# Patient Record
Sex: Female | Born: 1975
Health system: Southern US, Community
[De-identification: ages and names within clinical notes are randomized; demographics above are authoritative.]

## PROBLEM LIST (undated history)

## (undated) DIAGNOSIS — F419 Anxiety disorder, unspecified: Secondary | ICD-10-CM

## (undated) DIAGNOSIS — D649 Anemia, unspecified: Secondary | ICD-10-CM

## (undated) DIAGNOSIS — J45909 Unspecified asthma, uncomplicated: Secondary | ICD-10-CM

## (undated) DIAGNOSIS — N939 Abnormal uterine and vaginal bleeding, unspecified: Secondary | ICD-10-CM

## (undated) DIAGNOSIS — N920 Excessive and frequent menstruation with regular cycle: Secondary | ICD-10-CM

## (undated) DIAGNOSIS — N189 Chronic kidney disease, unspecified: Secondary | ICD-10-CM

## (undated) DIAGNOSIS — I10 Essential (primary) hypertension: Secondary | ICD-10-CM

## (undated) DIAGNOSIS — Z9109 Other allergy status, other than to drugs and biological substances: Secondary | ICD-10-CM

## (undated) DIAGNOSIS — F32A Depression, unspecified: Secondary | ICD-10-CM

## (undated) DIAGNOSIS — Z8759 Personal history of other complications of pregnancy, childbirth and the puerperium: Secondary | ICD-10-CM

## (undated) DIAGNOSIS — Z973 Presence of spectacles and contact lenses: Secondary | ICD-10-CM

## (undated) DIAGNOSIS — T7840XA Allergy, unspecified, initial encounter: Secondary | ICD-10-CM

## (undated) DIAGNOSIS — K219 Gastro-esophageal reflux disease without esophagitis: Secondary | ICD-10-CM

## (undated) DIAGNOSIS — M1990 Unspecified osteoarthritis, unspecified site: Secondary | ICD-10-CM

## (undated) HISTORY — DX: Anemia, unspecified: D64.9

## (undated) HISTORY — PX: NASAL SINUS SURGERY: SHX719

## (undated) HISTORY — DX: Gastro-esophageal reflux disease without esophagitis: K21.9

## (undated) HISTORY — DX: Unspecified asthma, uncomplicated: J45.909

## (undated) HISTORY — DX: Essential (primary) hypertension: I10

## (undated) HISTORY — DX: Allergy, unspecified, initial encounter: T78.40XA

## (undated) HISTORY — DX: Anxiety disorder, unspecified: F41.9

## (undated) HISTORY — DX: Chronic kidney disease, unspecified: N18.9

## (undated) HISTORY — DX: Depression, unspecified: F32.A

---
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
## 2000-09-11 ENCOUNTER — Other Ambulatory Visit: Admission: RE | Admit: 2000-09-11 | Discharge: 2000-09-11 | Payer: Self-pay | Admitting: *Deleted

## 2001-10-10 ENCOUNTER — Other Ambulatory Visit: Admission: RE | Admit: 2001-10-10 | Discharge: 2001-10-10 | Payer: Self-pay | Admitting: Obstetrics and Gynecology

## 2002-12-03 ENCOUNTER — Other Ambulatory Visit: Admission: RE | Admit: 2002-12-03 | Discharge: 2002-12-03 | Payer: Self-pay | Admitting: Obstetrics & Gynecology

## 2007-07-10 ENCOUNTER — Inpatient Hospital Stay (HOSPITAL_COMMUNITY): Admission: AD | Admit: 2007-07-10 | Discharge: 2007-07-10 | Payer: Self-pay | Admitting: Obstetrics and Gynecology

## 2007-10-26 ENCOUNTER — Inpatient Hospital Stay (HOSPITAL_COMMUNITY): Admission: AD | Admit: 2007-10-26 | Discharge: 2007-10-26 | Payer: Self-pay | Admitting: Obstetrics and Gynecology

## 2007-10-27 ENCOUNTER — Inpatient Hospital Stay (HOSPITAL_COMMUNITY): Admission: AD | Admit: 2007-10-27 | Discharge: 2007-10-27 | Payer: Self-pay | Admitting: Obstetrics and Gynecology

## 2007-10-28 ENCOUNTER — Encounter (INDEPENDENT_AMBULATORY_CARE_PROVIDER_SITE_OTHER): Payer: Self-pay | Admitting: Obstetrics and Gynecology

## 2007-10-28 ENCOUNTER — Inpatient Hospital Stay (HOSPITAL_COMMUNITY): Admission: AD | Admit: 2007-10-28 | Discharge: 2007-10-31 | Payer: Self-pay | Admitting: Obstetrics and Gynecology

## 2009-07-14 ENCOUNTER — Emergency Department (HOSPITAL_COMMUNITY): Admission: AD | Admit: 2009-07-14 | Discharge: 2009-07-15 | Payer: Self-pay | Admitting: Family Medicine

## 2011-02-03 LAB — LIPASE, BLOOD: Lipase: 19 U/L (ref 11–59)

## 2011-02-03 LAB — COMPREHENSIVE METABOLIC PANEL
Alkaline Phosphatase: 55 U/L (ref 39–117)
BUN: 7 mg/dL (ref 6–23)
CO2: 30 mEq/L (ref 19–32)
Calcium: 9.7 mg/dL (ref 8.4–10.5)
Chloride: 102 mEq/L (ref 96–112)
Creatinine, Ser: 0.76 mg/dL (ref 0.4–1.2)
Total Bilirubin: 1.3 mg/dL — ABNORMAL HIGH (ref 0.3–1.2)
Total Protein: 8 g/dL (ref 6.0–8.3)

## 2011-02-03 LAB — URINALYSIS, ROUTINE W REFLEX MICROSCOPIC
Ketones, ur: NEGATIVE mg/dL
Protein, ur: NEGATIVE mg/dL
Urobilinogen, UA: 0.2 mg/dL (ref 0.0–1.0)
pH: 7 (ref 5.0–8.0)

## 2011-02-03 LAB — CBC
HCT: 42.2 % (ref 36.0–46.0)
Hemoglobin: 14.5 g/dL (ref 12.0–15.0)
MCHC: 34.3 g/dL (ref 30.0–36.0)
MCV: 86.9 fL (ref 78.0–100.0)
Platelets: 227 10*3/uL (ref 150–400)

## 2011-02-03 LAB — DIFFERENTIAL
Basophils Absolute: 0 10*3/uL (ref 0.0–0.1)
Monocytes Relative: 5 % (ref 3–12)

## 2011-03-14 NOTE — Op Note (Signed)
NAMEBELIA, FEBO            ACCOUNT NO.:  [id number]   MEDICAL RECORD NO.:  [id number]          PATIENT TYPE:  INP   LOCATION:  9123                          FACILITY:  [facility]   PHYSICIAN:  Crist Fat. Rivard, M.D. DATE OF BIRTH:  [birth year]   DATE OF PROCEDURE:  10/28/2007  DATE OF DISCHARGE:                               OPERATIVE REPORT   PREOPERATIVE DIAGNOSIS:  Intrauterine pregnancy at 42 weeks and 2 days  with a previous cesarean section and failure to progress.   POSTOPERATIVE DIAGNOSIS:  Intrauterine pregnancy at 42 weeks and 2 days  with a previous cesarean section and failure to progress.   ANESTHESIA:  Epidural.   ANESTHESIOLOGIST:  Angelica Pou, M.D.   PROCEDURE:  Repeat low transverse cesarean section.   SURGEON:  Crist Fat. Rivard, M.D.   ASSISTANTRenaldo Reel. Latham, C.N.M.   ESTIMATED BLOOD LOSS:  700 mL.   PROCEDURE:  After being informed of the planned procedure with possible  complications including bleeding, infection, injury to bowel, bladder,  or ureters, informed consent is obtained.  The patient is taken to OR #1  and preexisting epidural anesthesia is reinforced.  The patient is  placed in the dorsal decubitus position, pelvis tilted to the left, and  a Foley catheter is in her bladder.  She is prepped and draped in a  sterile fashion.   After assessing adequate level of anesthesia, we infiltrate the previous  Pfannenstiel incision with Marcaine 0.25%, 20 mL.  We then perform a  Pfannenstiel incision which is brought down sharply to the fascia, and  the fascia is incised in a low transverse fashion.  The linea alba is  dissected.  __________  is entered in the midline fashion.  Visceral  peritoneum is entered in a low transverse fashion allowing [place] to safely  retract bladder by developing a bladder flap.  Alexis retractor is  inserted, and we can now proceed with the entry in the myometrium in a  low transverse fashion first with knife then  extended bluntly.  Amniotic  fluid is thin meconium.  We assist the birth of a female infant in LOA  presentation.  Mouth and nose are suctioned with DeLee suction.  Nuchal  cord is reduced, and the baby is delivered.  The cord is clamped with  two Kelly clamps and sectioned, and the baby is given to the  neonatologist present in the room.  The patient is then given Ancef 2  grams IV, and Pitocin perfusion is started.  We await the spontaneous  delivery of the placenta which is complete and has three vessels, and  the placenta is sent for cord blood donation and then to pathology.  Uterine revision is negative.  We then perform closure of the myometrium  in two layers, first with a running lock suture of 0 Vicryl and then  with a Lembert suture of 0 Vicryl, imbricating the first one.  Hemostasis on the left angle is completed with two figure-of-eight  stitches of 0 Vicryl.  We then complete hemostasis on the peritoneal  edges with cauterization, and we profusely irrigate  the pelvis with warm  saline after cleaning both paracolic gutters and assessing both tubes  and ovaries which are normal.  We then reassessed hemostasis which is  deemed adequate, but for precaution, Gelfoam is applied to the left  angle.  The retractor is removed under fascia.  Hemostasis is completed  with cautery, and the fascia is closed with two running sutures of 1  Vicryl meeting midline.  The wound is irrigated with warm saline.  Hemostasis is completed with cautery, and the skin is closed with  subcuticular suture of 3-0 Monocryl and Steri-Strips.   Instrument and sponge count is complete x2.  Estimated blood loss is 700  mL.  The procedure is very well tolerated by the patient who is taken to  the recovery room in a well and stable condition.   Little boy named Jamison Oka was born at 10:40 p.m., received an Apgar  of 9 at 1 minute and 9 at 5 minutes, and weighed 10 pounds 10 ounces.   SPECIMENS:  Placenta  sent to cord blood donation and then to pathology.      Crist Fat Rivard, M.D.  Electronically Signed     SAR/MEDQ  D:  10/28/2007  T:  10/29/2007  Job:  [id number]

## 2011-03-14 NOTE — H&P (Signed)
[name], [name]            ACCOUNT NO.:  [id number]   MEDICAL RECORD NO.:  [id number]          PATIENT TYPE:  INP   LOCATION:  9163                          [facility]ILITY:  [facility]   PHYSICIAN:  Hal Morales, M.D.DATE OF BIRTH:  08/04/1976   DATE OF ADMISSION:  10/28/2007  DATE OF DISCHARGE:                              HISTORY & PHYSICAL   This is a 35 year old gravida 2, para 1-0-0-1 at 42-2/7 weeks who  presents with bleeding and contractions.  She denies leaking and reports  positive fetal movement.  Pregnancy has been followed by the nurse  midwife service and remarkable for  1. Previous C-section, plans VBAC.  2. History of LGA.  3. History PIH.  4. History of asthma.  5. History of postpartum depression.   ALLERGIES:  BIAXIN, ANTIHISTAMINES and MORPHINE.   OBSTETRICAL HISTORY:  Remarkable for C-section in 2007 of a female infant  at 12 weeks' gestation weighing 10 pounds with no labor.  She had high  blood pressure and LGA with that pregnancy.   MEDICAL HISTORY:  Remarkable for  1. History of anemia.  2. History of postpartum depression.  3. History of abnormal Pap.  4. Childhood varicella.  5. History of asthma.   SURGICAL HISTORY:  Remarkable for  1. Wisdom teeth.  2. Toe surgery.  3. Sinus surgery.  4. Mole removal.  5. C-section.   FAMILY HISTORY:  Remarkable for a grandmother and father with heart  disease.  Several family members with hypertension and several family  members with thyroid problems.  Uncle with diabetes.  Grandfather with  lung cancer and grandfather with bladder cancer.   GENETIC HISTORY:  Remarkable for cousin with mental retardation and  grandmother born with one kidney.   SOCIAL HISTORY:  The patient is married to Karna Christmas, who is  involved and supportive.  She is of [employer] faith.  She denies any  alcohol, tobacco or drug use.   PERTINENT LABORATORY DATA:  Hemoglobin 14.1, platelets 214.  Blood type  A+, antibody screen  negative, RPR nonreactive, rubella immune.  Hepatitis negative, HIV negative.  Pap test normal.  Gonorrhea negative,  chlamydia negative.  Cystic fibrosis declined.   HISTORY OF CURRENT PREGNANCY:  The patient entered care at [redacted] weeks  gestation.  New OB was done at 10 weeks.  First trimester screen was  normal.  Of note, showed a two layer closure.  She had a normal ALPHA  FETOPROTEIN.  Ultrasound at 19 weeks was normal.  She met with Dr.  Stefano Gaul about VBAC and trial of labor and signed a consent form.  She  had many concerns in the third trimester regarding VBAC and declined  ultrasound for EFW.  Group B strep was negative at term and she has  started biophysical profiles and NSTs at 41 weeks which have all been  reassuring.   OBJECTIVE:  VITAL SIGNS:  Stable, afebrile.  HEENT:  Within normal limits.  NECK:  Thyroid normal not enlarged.  CHEST:  Clear to auscultation.  HEART: Regular rate and rhythm.  ABDOMEN:  Gravid, vertex Bradshaw. CFM shows reactive fetal heart  rate  after a period of nonreactivity.  Uterine contractions every 8-10  minutes.  Cervix is 1-2 cm, 95% effaced, -2 station with vertex  presentation, positive bloody show.  EXTREMITIES:  Within normal limits.   ASSESSMENT:  1. Intrauterine pregnancy at 42-2/7 weeks.  2. Post dates  3. Third trimester bleeding probably show.   PLAN:  1. Admit to birthing suites per Dr. Pennie Rushing.  2. Routine CNM orders.  3. The patient prefers monitoring and resting overnight and will      discuss augmentation in the morning with the next midwife.      Marie L. Williams, C.N.M.      Hal Morales, M.D.  Electronically Signed    MLW/MEDQ  D:  10/28/2007  T:  10/28/2007  Job:  [id number]

## 2011-03-14 NOTE — Discharge Summary (Signed)
[name], [name]            ACCOUNT NO.:  [id number]   MEDICAL RECORD NO.:  [id number]          PATIENT TYPE:  INP   LOCATION:  9123                          [facility]   PHYSICIAN:  Crist Fat. Rivard, M.D. DATE OF BIRTH:  Nov 08, 1975   DATE OF ADMISSION:  10/28/2007  DATE OF DISCHARGE:  10/31/2007                               DISCHARGE SUMMARY   ADMISSION DIAGNOSES:  1. Intrauterine pregnancy at 67 and 2/7 weeks.  2. Post date.  3. Third trimester bleeding, probability of bloody show.  4. Early labor.  5. Previous cesarean section with plan for vaginal birth after      cesarean.  6. History of large-for-gestational-age infant.  7. History of pregnancy-induced hypertension.  8. History of asthma.  9. History of postpartum depression.  10.Failure to progress.   DISCHARGE DIAGNOSES:  1. Intrauterine pregnancy at 33 and 2/7 weeks.  2. Post term.  3. Failure to progress.  4. Status post repeat low tranverse cesarean section for failure to      progress and delivery of a viable female named [name], who      weighed 10 pounds, 10 ounces with Apgars of 9 and 9.  5. Breastfeeding with __________ assistance.  6. Defer contraception at this time.  7. History of postpartum depression.   PROCEDURES:  1. Repeat low transverse cesarean section.  2. Epidural anesthesia.   HOSPITAL COURSE:  [name] is a 35 year old gravida 2, para 1who  presented early morning of October 28, 2007, with a chief complaint of  bleeding and contractions and probable early labor.  Her care had been  followed by the nurse midwife service at [facility].  Her  pregnancy had been remarkable for, 1) a previous C-section with plan to  VBAC, 2) history of __________ , 3) history of  PIH, 4) history of  asthma, 5) history of postpartum depression, 6)  group beta strep  negative.   On admission, the patient's cervix was 1 to 2 cm, 95% effaced, -2  station with vertex presentation and  positive bloody show.  Fetal heart  rate was reassuring.  She was having uterine contractions approximately  every 8 to 10 minutes.  The patient was admitted to the birthing suite  per consult with Dr. Pennie Rushing and desired rest during the night and  waiting until morning to discuss augmentation of labor.  During the  night and early morning of December 29, the patient slept off and on.  She was in and out of tubs, soaking for comfort.  Fetal heart rate was  stable, and she continued to have a small amount of bloody show, and she  declined cervical exam.  At approximately 8:00 a.m. on December 29, the  patient's cervix was noted to be 1 cm, 90% effaced, vertex -2, and it  was noted that vertex was not well applied to cervix.  After cervical  exam per Chip Boer L. Emilee Hero, Certified Nurse Midwife, issues regarding  prolonged prodromal labor and station of vertex were discussed with the  patient and her husband.  Noted estimated fetal weight on previous  ultrasound was 9 pounds, 1 ounce.  Nigel Bridgeman reviewed options to, 1)  initiate Pitocin augmentation, 2) Foley bulb, or 3) repeat cesarean  section, or 4) discharge home, which was not recommended.  The risks and  benefits were discussed with the patient and her husband, and they  desired time together to review options.  The patient did elect to have  a Foley bulb placed.  Fetal heart rate remained reactive and a Foley  bulb was then placed at approximately 10:50 a.m., and the balloon was  inflated with 60 mL of fluid.  As the day progressed, the patient  continued to have uterine contractions.  They increased in frequency to  approximately every 5 minutes with more discomfort.  Fetal heart rate  remained reassuring on intermittent monitoring.  At approximately 3:30  in the afternoon of December 29, Foley bulb was still in place and  unable to be removed with traction.  At approximately 5:10 p.m. on  December 29, the patient was requesting  evaluation.  Uterine  contractions were approximately every 5 minutes.  Fetal heart rate was  reassuring on intermittent monitoring.  Foley bulb was removed with  slight traction.  Cervix was noted to be 2 cm, 90% effaced, vertex, -2  to -3.  Following evaluation, the patient was desiring an epidural.  At  initial measure at approximately 7:15 p.m. on December 29, the patient  was comfortable with epidural.  Fetal heart rate was reactive without  decelerations.  Uterine contractions were mild, every 3 to 6 minutes,  and the patient's cervix was unchanged on exam.  At that time, the  patient continued to decline Pitocin augmentation.  She was considering  C-section but wished to have more time to decide.  At approximately 9:45  p.m., discussion with the patient regarding labor status.  Uterine  contractions were irregular; however, fetal heart rate was reactive and  without further labor progress.  The patient did desire to proceed with  a repeat low transverse cesarean section.  The risks and benefits were  reviewed, including bleeding, infection and damage to surrounding  organs.  Consult with Dr. Estanislado Pandy, and plan was made to proceed with  repeat low transverse cesarean section secondary to failure to progress.  At approximately 10:00 p.m., Dr. Estanislado Pandy was at bedside.  Questions were  answered, and the patient was prepped and taken to the OR for repeat low  transverse cesarean section for failure to progress.  The low transverse  cesarean section for failure to progress was performed by Dr. Crist Fat.  Rivard as a repeat for patient.  Findings were a viable female at 2240  weighing 10 pounds, 10 ounces with Apgars 9 and 9, by the name of [name].  The patient tolerated the procedure very well and was taken to  the recovery room in well and stable condition.  Newborn female was taken  to full-term nursery in good condition.  By postoperative day number  one, the patient was breastfeeding.   She was without complaints.  She  did desire PCA to be discontinued secondary to grogginess.  She did  voice being pleased with her birth experience.  She did have concerns  regarding previous significant constipation following her first C-  section and desired additional stool softener.  She did report at that  time her last bowel movement was Sunday afternoon, December 28.  She was  tolerating a regular diet.  She had positive flatulence and declined  circumcision.  On postoperative day number 1, all vital signs were  stable and she was afebrile.  Her CBC was put in for December 31, which  would be postoperative day number 2.  Her physical examination was  within normal limits.  Her abdomen was soft, appropriately tender.  Her  postoperative dressing was clean, dry, and intact at the incision site.  Extremities were within normal limits with mild generalized edema and  negative Homans.  She had scant lochia, and her breasts were soft and  nontender.  Docusate sodium was ordered 100 mg p.o. b.i.d. p.r.n. per  patient request.  On postoperative day number 2, the patient continued  to do well.  She was up ad lib, having normal urinary function.  She  stated that she did not feel that constipation would be an issue this  time.  Darvocet had been ordered and the patient was doing well  utilizing Darvocet versus Percocet for pain relief.  The patient desired  this secondary to decreased sedation effect.  Her vital signs remained  stable.  Her physical examination remained within normal limits.  Her  abdomen was soft, nontender with positive bowel sounds and incision site  was within normal limits.  On postoperative day number 2, the patient  was doing well.  She was ready to go home.  She did report newborn female  with blood sugar issues and has been utilizing __________  in addition  to breastfeeding and noted a plan to follow up with appointment  secondary to __________  at [facility].  She did report right  side of incision edge was slightly more uncomfortable than her left but  was doing well on Motrin and Darvocet.  She was tolerating a regular  diet and doing well otherwise.  Her vital signs remained stable.  Her  hemoglobin on postoperative day number 2 was 11.1.  Her physical  examination remained within normal limits.  The abdomen was soft.  The  fundus was firm.  Her Steri-Strips were intact.  There was no redness or  exudate.  The right edge of her incision was slightly more elevated  whereas the left was a little smoother.  She did not have any  induration.  Discussion was made with the patient and the patient's  husband regarding her history of postpartum depression and signs and  symptoms to report.  She was deemed to have received full benefit of her  hospital stay and was discharged home.  Discharge instructions were per  [facility] handout.   DISCHARGE MEDICATIONS:  1. Motrin 600 mg p.o. q.6h. p.r.n. pain.  2. Darvocet-N 100 one tab p.o. q.3-4h. p.r.n. moderate-to-severe pain.  3. The patient was to continue taking her prenatal vitamin while      breastfeeding.   Discharge followup to occur in 6 weeks at [facility] or p.r.n.  The patient deferred initiation of contraception at the time of  discharge and to reassess the patient's desires at her 6-week followup.      Candice Fidelis, CNM      Dois Davenport A. Rivard, M.D.  Electronically Signed    CHS/MEDQ  D:  10/31/2007  T:  10/31/2007  Job:  [id number]

## 2011-08-04 LAB — CBC
HCT: 38.8
Hemoglobin: 11.1 — ABNORMAL LOW
Hemoglobin: 13.4
MCHC: 34.4
MCV: 90
Platelets: 159
RBC: 3.56 — ABNORMAL LOW
RBC: 4.36
RDW: 14.3
WBC: 12.9 — ABNORMAL HIGH
WBC: 14.5 — ABNORMAL HIGH

## 2011-08-11 LAB — URINALYSIS, ROUTINE W REFLEX MICROSCOPIC
Glucose, UA: NEGATIVE
Hgb urine dipstick: NEGATIVE
Ketones, ur: NEGATIVE
Protein, ur: NEGATIVE
Specific Gravity, Urine: <1.005 — ABNORMAL LOW
Urobilinogen, UA: 0.2
pH: 6.5

## 2013-03-27 ENCOUNTER — Ambulatory Visit: Payer: BC Managed Care – PPO | Admitting: Physician Assistant

## 2013-03-27 VITALS — BP 118/72 | HR 68 | Temp 97.5°F | Resp 16 | Ht 64.0 in | Wt 165.0 lb

## 2013-03-27 DIAGNOSIS — R059 Cough, unspecified: Secondary | ICD-10-CM

## 2013-03-27 DIAGNOSIS — T485X1A Poisoning by other anti-common-cold drugs, accidental (unintentional), initial encounter: Secondary | ICD-10-CM

## 2013-03-27 DIAGNOSIS — R05 Cough: Secondary | ICD-10-CM

## 2013-03-27 DIAGNOSIS — J40 Bronchitis, not specified as acute or chronic: Secondary | ICD-10-CM

## 2013-03-27 LAB — POCT CBC
HCT, POC: 45.4 % (ref 37.7–47.9)
MCH, POC: 29.9 pg (ref 27–31.2)
MCHC: 32.8 g/dL (ref 31.8–35.4)
MCV: 90.9 fL (ref 80–97)
MPV: 8.7 fL (ref 0–99.8)
POC MID %: 5.5 %M (ref 0–12)
Platelet Count, POC: 185 10*3/uL (ref 142–424)
RDW, POC: 13 %

## 2013-03-27 MED ORDER — PREDNISONE 20 MG PO TABS: ORAL_TABLET | ORAL | Status: DC

## 2013-03-27 MED ORDER — MOXIFLOXACIN HCL 400 MG PO TABS: 400.0000 mg | ORAL_TABLET | Freq: Every day | ORAL | Status: DC

## 2013-03-27 MED ORDER — BENZONATATE 100 MG PO CAPS: 100.0000 mg | ORAL_CAPSULE | Freq: Three times a day (TID) | ORAL | Status: DC | PRN

## 2013-03-27 MED ORDER — IPRATROPIUM BROMIDE 0.06 % NA SOLN: 2.0000 | Freq: Three times a day (TID) | NASAL | Status: DC

## 2013-03-27 MED ORDER — HYDROCODONE-HOMATROPINE 5-1.5 MG/5ML PO SYRP: ORAL_SOLUTION | ORAL | Status: DC

## 2013-03-27 NOTE — Progress Notes (Signed)
Patient ID: [name] MRN: [id number], DOB: December 08, 1975, 37 y.o. Date of Encounter: 03/27/2013, 1:21 PM  Primary Physician: No primary provider on file.  Chief Complaint:  Chief Complaint  Patient presents with  . Cough    began last Friday- productive  . Fever    began Sunday- 100.5- lasted about 3 days  . Nasal Congestion    saw PCP Tuesday 03/25/13- Rx'ed Z-pack (Pt feels like this is not helping)    HPI: 37 y.o. female presents with a 6 day history of nasal congestion, post nasal drip, sore throat, sinus pressure, and cough. Febrile at symptom onset. Patient initially seen by PCP and placed on Z pack. Fever has resolved. Other symptoms remain. Nasal congestion thick and green/yellow. Cough is productive of green/yellow sputum and not associated with time of day. No SOB or wheezing. Ears feel full, leading to sensation of muffled hearing. No GI complaints. Appetite decreased. Has been using Afrin 3-4 times per day for 6 days now. Kids and friend's kids have been sick with similar symptoms. No recent travels. States when she gets a URI it usually takes a while for her to get better.   No leg trauma, sedentary periods, h/o cancer, or tobacco use.  Past Medical History  Diagnosis Date  . Allergy   . Asthma      Home Meds: Prior to Admission medications   Medication Sig Start Date End Date Taking? Authorizing Provider  Dextromethorphan-Guaifenesin (MUCINEX FAST-MAX DM MAX) 5-100 MG/5ML LIQD Take by mouth.   Yes Historical Provider, MD  oxymetazoline (AFRIN) 0.05 % nasal spray Place 2 sprays into the nose 2 (two) times daily.   Yes Historical Provider, MD                                       Allergies:  Allergies  Allergen Reactions  . Antihistamines, Diphenhydramine-Type Other (See Comments)    Oral antihistamines makes PT depressed  . Biaxin (Clarithromycin) Other (See Comments)    Migraines    History   Social History  . Marital Status: Married   Spouse Name: N/A    Number of Children: N/A  . Years of Education: N/A   Occupational History  . Not on file.   Social History Main Topics  . Smoking status: Never Smoker   . Smokeless tobacco: Not on file  . Alcohol Use: Yes     Comment: 7-8/week  . Drug Use: No  . Sexually Active: Not on file   Other Topics Concern  . Not on file   Social History Narrative  . No narrative on file     Review of Systems: Constitutional: positive for fever, chills, and fatigue HEENT: see above Cardiovascular: negative for chest pain or palpitations Respiratory: positive for cough. negative for wheezing, or shortness of breath Abdominal: positive for diarrhea. negative for abdominal pain, nausea, or vomiting  Dermatological: negative for rash Neurologic: negative for headache   Physical Exam: Blood pressure 118/72, pulse 68, temperature 97.5 F (36.4 C), temperature source Oral, resp. rate 16, height 5\' 4"  (1.626 m), weight 165 lb (74.844 kg), last menstrual period 03/05/2013, SpO2 100.00%., Body mass index is 28.31 kg/(m^2). General: Well developed, well nourished, in no acute distress. Not toxic appearing.  Head: Normocephalic, atraumatic, eyes without discharge, sclera non-icteric, nares are congested. Bilateral auditory canals clear, TM's are without perforation, pearly grey with reflective cone of  light bilaterally. Air fluid level behind TM's. Maxillary sinus TTP. Oral cavity moist, dentition normal. Posterior pharynx with post nasal drip and mild erythema. No peritonsillar abscess or tonsillar exudate. Neck: Supple. No thyromegaly. Full ROM. No lymphadenopathy. Lungs: Coarse breath sounds bilaterally without wheezes, rales, or rhonchi. Breathing is unlabored.  Heart: RRR with S1 S2. No murmurs, rubs, or gallops appreciated. Msk:  Strength and tone normal for age. Extremities: No clubbing or cyanosis. No edema. Neuro: Alert and oriented X 3. Moves all extremities spontaneously. CNII-XII  grossly in tact. Psych:  Responds to questions appropriately with a normal affect.   Labs: Results for orders placed in visit on 03/27/13  POCT CBC      Result Value Range   WBC 8.2  4.6 - 10.2 K/uL   Lymph, poc 3.0  0.6 - 3.4   POC LYMPH PERCENT 35.2  10 - 50 %L   MID (cbc) 0.5  0 - 0.9   POC MID % 5.5  0 - 12 %M   POC Granulocyte 4.8  2 - 6.9   Granulocyte percent 58.3  37 - 80 %G   RBC 4.99  4.04 - 5.48 M/uL   Hemoglobin 14.9  12.2 - 16.2 g/dL   HCT, POC 16.1  09.6 - 47.9 %   MCV 90.9  80 - 97 fL   MCH, POC 29.9  27 - 31.2 pg   MCHC 32.8  31.8 - 35.4 g/dL   RDW, POC 04.5     Platelet Count, POC 185  142 - 424 K/uL   MPV 8.7  0 - 99.8 fL     ASSESSMENT AND PLAN:  37 y.o. female with bronchitis, cough, and rhinitis medicamentosa .  -Stop azithromycin -Avelox 400 mg 1 po daily #10 no RF -Atrovent NS 0.06% 2 sprays each nare bid prn #1 no RF -Hycodan #4oz 1 tsp po q 4-6 hours prn cough no RF SED -Tessalon Perles 100 mg 1 po tid prn cough #30 no RF  -Stop Afrin -Mucinex -Tylenol/Motrin prn -Rest/fluids -Declines to start prednisone at this time, she will take a paper prescription and start this if needed within the next couple of days -Prednisone 20 mg #18 3x3, 2x3, 1x3 no RF -RTC precautions -RTC 3-5 days if no improvement  Signed, Eula Listen, PA-C 03/27/2013 1:21 PM

## 2013-03-28 ENCOUNTER — Telehealth: Payer: Self-pay

## 2013-03-28 NOTE — Telephone Encounter (Signed)
Patient is having a strange reaction to Avelox. Feeling jittery and shaky. [phone]

## 2013-03-28 NOTE — Telephone Encounter (Signed)
Jittery and shaky is the prednisone not the Avelox, called her to advise. She has not taken the prednisone yet. She did take this with food. Please advise.

## 2013-03-30 NOTE — Telephone Encounter (Signed)
Spoke with patient. She states she is slowly on the mend. She is still taking the avelox and she is taking the prednisone. She thinks she figured out what the problem was. The other day she took avelox, nasal spray, and tessalon perls and drank coffee. So the next day she spaced out the doses and she didn't feel as bad. She has since stopped the nasal spray and tessalon because she doesn't feel like she still needs it. Overall she does feel better and will finish avelox and prednisone.

## 2013-03-30 NOTE — Telephone Encounter (Signed)
Please call.   Somewhat unusual to have these symptoms with Avelox.  Is she using any of the other medications prescribed/recommended (atrovent nasal spray, tessalon perles, hycodan syrup?). How is she feeling today?

## 2013-03-30 NOTE — Telephone Encounter (Signed)
Great. Thank you.

## 2013-12-05 ENCOUNTER — Other Ambulatory Visit: Payer: Self-pay | Admitting: Obstetrics and Gynecology

## 2013-12-05 DIAGNOSIS — N644 Mastodynia: Secondary | ICD-10-CM

## 2013-12-12 ENCOUNTER — Ambulatory Visit
Admission: RE | Admit: 2013-12-12 | Discharge: 2013-12-12 | Disposition: A | Discharge status: Home / Self Care | Payer: BC Managed Care – PPO | Source: Ambulatory Visit | Attending: Obstetrics and Gynecology | Admitting: Obstetrics and Gynecology

## 2013-12-12 ENCOUNTER — Other Ambulatory Visit: Payer: Self-pay | Admitting: Obstetrics and Gynecology

## 2013-12-12 DIAGNOSIS — N644 Mastodynia: Secondary | ICD-10-CM

## 2014-06-08 ENCOUNTER — Ambulatory Visit (INDEPENDENT_AMBULATORY_CARE_PROVIDER_SITE_OTHER): Payer: BC Managed Care – PPO | Admitting: Physician Assistant

## 2014-06-08 VITALS — BP 128/86 | HR 71 | Temp 98.2°F | Resp 16 | Ht 64.0 in | Wt 176.8 lb

## 2014-06-08 DIAGNOSIS — J0100 Acute maxillary sinusitis, unspecified: Secondary | ICD-10-CM

## 2014-06-08 MED ORDER — PREDNISONE 20 MG PO TABS: ORAL_TABLET | ORAL | Status: DC

## 2014-06-08 MED ORDER — CEFDINIR 300 MG PO CAPS: 600.0000 mg | ORAL_CAPSULE | Freq: Every day | ORAL | Status: DC

## 2014-06-08 MED ORDER — IPRATROPIUM BROMIDE 0.03 % NA SOLN: 2.0000 | Freq: Two times a day (BID) | NASAL | Status: DC

## 2014-06-08 MED ORDER — BENZONATATE 100 MG PO CAPS: 100.0000 mg | ORAL_CAPSULE | Freq: Three times a day (TID) | ORAL | Status: DC | PRN

## 2014-06-08 NOTE — Patient Instructions (Signed)
Stop the decongestant containing product, but continue the Mucinex to thin the mucous.

## 2014-06-08 NOTE — Progress Notes (Signed)
Subjective:    Patient ID: [name], female    DOB: 04-01-1976, 38 y.o.   MRN: [id number]   PCP: No primary provider on file.  Chief Complaint  Patient presents with  . Facial Pain    congestion; green mucus x almost 1 week - yesterday got worse.  . Otalgia    Medications, allergies, past medical history, surgical history, family history, social history and problem list reviewed and updated.  HPI  Presents with 1 week of sinus pressure and congestion, green nasal drainage and cough.  Yesterday symptoms worsened, "it feels like it became cement" in the sinuses.  Lots of facial pressure/pain.  Also bilateral ear fullness and pain, L>R. No fever, chills.  Review of Systems As above.    Objective:   Physical Exam  Vitals reviewed. Constitutional: She is oriented to person, place, and time. Vital signs are normal. She appears well-developed and well-nourished. She is active and cooperative. No distress.  BP 128/86  Pulse 71  Temp(Src) 98.2 F (36.8 C) (Oral)  Resp 16  Ht 5\' 4"  (1.626 m)  Wt 176 lb 12.8 oz (80.196 kg)  BMI 30.33 kg/m2  SpO2 100%  LMP 05/18/2014   HENT:  Head: Normocephalic and atraumatic.  Right Ear: Hearing, tympanic membrane, external ear and ear canal normal.  Left Ear: Hearing, external ear and ear canal normal. Tympanic membrane is injected and retracted.  Nose: Mucosal edema and rhinorrhea present.  No foreign bodies. Right sinus exhibits no maxillary sinus tenderness and no frontal sinus tenderness. Left sinus exhibits no maxillary sinus tenderness and no frontal sinus tenderness.  Mouth/Throat: Uvula is midline, oropharynx is clear and moist and mucous membranes are normal. No uvula swelling. No oropharyngeal exudate.  Eyes: Conjunctivae and EOM are normal. Pupils are equal, round, and reactive to light. Right eye exhibits no discharge. Left eye exhibits no discharge. No scleral icterus.  Neck: Trachea normal, normal range of motion and full  passive range of motion without pain. Neck supple. No mass and no thyromegaly present.  Cardiovascular: Normal rate, regular rhythm and normal heart sounds.   Pulmonary/Chest: Effort normal and breath sounds normal.  Lymphadenopathy:       Head (right side): No submandibular, no tonsillar, no preauricular, no posterior auricular and no occipital adenopathy present.       Head (left side): No submandibular, no tonsillar, no preauricular and no occipital adenopathy present.    She has no cervical adenopathy.       Right: No supraclavicular adenopathy present.       Left: No supraclavicular adenopathy present.  Neurological: She is alert and oriented to person, place, and time. She has normal strength. No cranial nerve deficit or sensory deficit.  Skin: Skin is warm, dry and intact. No rash noted.  Psychiatric: She has a normal mood and affect. Her speech is normal and behavior is normal.          Assessment & Plan:  1. Acute maxillary sinusitis, recurrence not specified Supportive care. Anticipatory guidance provided.  - cefdinir (OMNICEF) 300 MG capsule; Take 2 capsules (600 mg total) by mouth daily.  Dispense: 20 capsule; Refill: 0 - ipratropium (ATROVENT) 0.03 % nasal spray; Place 2 sprays into both nostrils 2 (two) times daily.  Dispense: 30 mL; Refill: 0 - benzonatate (TESSALON) 100 MG capsule; Take 1-2 capsules (100-200 mg total) by mouth 3 (three) times daily as needed for cough.  Dispense: 40 capsule; Refill: 0 - predniSONE (DELTASONE) 20 MG tablet;  Take 3 PO QAM x3days, 2 PO QAM x3days, 1 PO QAM x3days  Dispense: 18 tablet; Refill: 0  Return if symptoms worsen or fail to improve.  Fernande Bras, PA-C Physician Assistant-Certified [facility]

## 2015-03-12 ENCOUNTER — Other Ambulatory Visit (HOSPITAL_COMMUNITY): Payer: Self-pay | Admitting: Family Medicine

## 2015-03-12 ENCOUNTER — Ambulatory Visit (HOSPITAL_COMMUNITY)
Admission: RE | Admit: 2015-03-12 | Discharge: 2015-03-12 | Disposition: A | Discharge status: Home / Self Care | Payer: BLUE CROSS/BLUE SHIELD | Source: Ambulatory Visit | Attending: Family Medicine | Admitting: Family Medicine

## 2015-03-12 DIAGNOSIS — R52 Pain, unspecified: Secondary | ICD-10-CM

## 2015-03-12 DIAGNOSIS — M79662 Pain in left lower leg: Secondary | ICD-10-CM | POA: Diagnosis not present

## 2015-03-12 NOTE — Progress Notes (Signed)
VASCULAR LAB PRELIMINARY  PRELIMINARY  PRELIMINARY  PRELIMINARY  LLEV completed.    Preliminary report:  Negative DVT LLEV.  Negative Baker's Cyst left popliteal fossa.  [name], [name], RVT 03/12/2015, 2:14 PM

## 2015-07-12 ENCOUNTER — Ambulatory Visit (INDEPENDENT_AMBULATORY_CARE_PROVIDER_SITE_OTHER): Payer: BLUE CROSS/BLUE SHIELD | Admitting: Physician Assistant

## 2015-07-12 VITALS — BP 128/74 | HR 89 | Temp 98.4°F | Resp 18 | Ht 64.5 in | Wt 180.0 lb

## 2015-07-12 DIAGNOSIS — R05 Cough: Secondary | ICD-10-CM

## 2015-07-12 DIAGNOSIS — J069 Acute upper respiratory infection, unspecified: Secondary | ICD-10-CM | POA: Diagnosis not present

## 2015-07-12 DIAGNOSIS — R059 Cough, unspecified: Secondary | ICD-10-CM

## 2015-07-12 MED ORDER — BENZONATATE 100 MG PO CAPS: 100.0000 mg | ORAL_CAPSULE | Freq: Three times a day (TID) | ORAL | Status: DC | PRN

## 2015-07-12 MED ORDER — HYDROCODONE-CHLORPHENIRAMINE 5-4 MG/5ML PO SOLN: 5.0000 mL | Freq: Every evening | ORAL | Status: DC | PRN

## 2015-07-12 MED ORDER — IPRATROPIUM BROMIDE 0.03 % NA SOLN: 2.0000 | Freq: Two times a day (BID) | NASAL | Status: DC

## 2015-07-12 NOTE — Progress Notes (Signed)
Patient ID: [name], female    DOB: 16-Jun-1976, 39 y.o.   MRN: [id number]  PCP: No primary care provider on file.  Subjective:   Chief Complaint  Patient presents with  . Sore Throat    friday   . Pleurisy    pressure from cough   . Cough    started saturday   . Nasal Congestion    HPI Presents for evaluation of congestion, cough and generalized malaise and fatigue x 3-4 days. Has pain in the chest with cough. Sore throat initially, but resolved after the first 24 hours of illness. She has a lot of sinus pressure and a cough with green mucous production that started on Saturday 9/10. She has had many sinus infections over the years, and has had surgery for this problem. She states that this is her first time being sick in over a year. She has taken Sudafed with some relief but this medication causes difficulty sleeping. She has also taken OTC Guaifenesin last night and this morning along with saline with minimal relief. She is taking Ibuprofen for her fever. She has not had a flu vaccine. No one else is sick at home.     Review of Systems Constitutional: Positive for fever (Under 100 on 9/11).  HENT: Positive for congestion, ear pain (Stuffy on saturday 9/10, then ears "popped and have felt better"), rhinorrhea, sinus pressure and sore throat.  Respiratory: Negative for shortness of breath.  Cardiovascular: Positive for chest pain (when coughing).      There are no active problems to display for this patient.    Prior to Admission medications   Medication Sig Start Date End Date Taking? Authorizing Provider  GuaiFENesin (MUCINEX PO) Take by mouth.   Yes Historical Provider, MD     Allergies  Allergen Reactions  . Antihistamines, Diphenhydramine-Type Other (See Comments)    Oral antihistamines makes PT depressed  . Biaxin [Clarithromycin] Other (See Comments)    Migraines       Objective:  Physical Exam  Constitutional: She is oriented to person, place,  and time. Vital signs are normal. She appears well-developed and well-nourished. No distress.  BP 128/74 mmHg  Pulse 89  Temp(Src) 98.4 F (36.9 C) (Oral)  Resp 18  Ht 5' 4.5" (1.638 m)  Wt 180 lb (81.647 kg)  BMI 30.43 kg/m2  SpO2 99%  LMP 06/23/2015   HENT:  Head: Normocephalic and atraumatic.  Right Ear: Hearing, tympanic membrane, external ear and ear canal normal.  Left Ear: Hearing, tympanic membrane, external ear and ear canal normal.  Nose: Mucosal edema and rhinorrhea present.  No foreign bodies. Right sinus exhibits no maxillary sinus tenderness and no frontal sinus tenderness. Left sinus exhibits no maxillary sinus tenderness and no frontal sinus tenderness.  Mouth/Throat: Uvula is midline, oropharynx is clear and moist and mucous membranes are normal. No uvula swelling. No oropharyngeal exudate.  Eyes: Conjunctivae and EOM are normal. Pupils are equal, round, and reactive to light. Right eye exhibits no discharge. Left eye exhibits no discharge. No scleral icterus.  Neck: Trachea normal, normal range of motion and full passive range of motion without pain. Neck supple. No thyroid mass and no thyromegaly present.  Cardiovascular: Normal rate, regular rhythm and normal heart sounds.   Pulmonary/Chest: Effort normal and breath sounds normal.  Lymphadenopathy:       Head (right side): No submandibular, no tonsillar, no preauricular, no posterior auricular and no occipital adenopathy present.  Head (left side): No submandibular, no tonsillar, no preauricular and no occipital adenopathy present.    She has no cervical adenopathy.       Right: No supraclavicular adenopathy present.       Left: No supraclavicular adenopathy present.  Neurological: She is alert and oriented to person, place, and time. She has normal strength. No cranial nerve deficit or sensory deficit.  Skin: Skin is warm, dry and intact. No rash noted.  Psychiatric: She has a normal mood and affect. Her speech  is normal and behavior is normal.       Assessment & Plan:   1. Viral URI Supportive care. Anticipatory guidance. If no improvement in the next 72 hours, I'd likely call in an antibiotic to cover for sinusitis. - ipratropium (ATROVENT) 0.03 % nasal spray; Place 2 sprays into both nostrils 2 (two) times daily.  Dispense: 30 mL; Refill: 0 - benzonatate (TESSALON) 100 MG capsule; Take 1-2 capsules (100-200 mg total) by mouth 3 (three) times daily as needed for cough.  Dispense: 40 capsule; Refill: 0  2. Cough Due to #1. - Hydrocodone-Chlorpheniramine 5-4 MG/5ML SOLN; Take 5 mLs by mouth at bedtime as needed (cough).  Dispense: 100 mL; Refill: 0   Fernande Bras, PA-C Physician Assistant-Certified [facility]

## 2015-07-12 NOTE — Progress Notes (Signed)
Subjective:     Patient ID: [name], female   DOB: 22-Jun-1976, 39 y.o.   MRN: [id number]   PCP: No primary care provider on file.   Chief Complaint  Patient presents with  . Sore Throat    friday   . Pleurisy    pressure from cough   . Cough    started saturday   . Nasal Congestion    HPI Patient is 39 yo F who presents with sore throat, cough, and nasal congestion. On Friday, 9/9, she started with a very sore throat which resolved over 24 hours. She has a lot of sinus pressure and a cough with green mucous production that started on Saturday 9/10. She has had many sinus infections over the years, and has had surgery for this problem. She states that this is her first time being sick in over a year. She has taken Sudafed with some relief but this medication causes difficulty sleeping. She has also taken OTC Guaifenesin last night and this morning along with saline with minimal relief. She is taking Ibuprofen for her fever. She has not had a flu vaccine. No one else is sick at home.   Review of Systems  Constitutional: Positive for fever (Under 100 on 9/11).  HENT: Positive for congestion, ear pain (Stuffy on saturday 9/10, then ears "popped and have felt better"), rhinorrhea, sinus pressure and sore throat.   Respiratory: Negative for shortness of breath.   Cardiovascular: Positive for chest pain (when coughing).  See HPI    There are no active problems to display for this patient.   Current outpatient prescriptions:  Marland Kitchen  GuaiFENesin (MUCINEX PO), Take by mouth., Disp: , Rfl:     Allergies  Allergen Reactions  . Antihistamines, Diphenhydramine-Type Other (See Comments)    Oral antihistamines makes PT depressed  . Biaxin [Clarithromycin] Other (See Comments)    Migraines    Objective:   Physical Exam  Constitutional: She is oriented to person, place, and time. She appears well-developed and well-nourished.  HENT:  Head: Normocephalic and atraumatic.  Right Ear:  Tympanic membrane, external ear and ear canal normal.  Left Ear: Tympanic membrane, external ear and ear canal normal.  Nose: Right sinus exhibits maxillary sinus tenderness and frontal sinus tenderness. Left sinus exhibits maxillary sinus tenderness and frontal sinus tenderness.  Mouth/Throat: Oropharynx is clear and moist and mucous membranes are normal. No oropharyngeal exudate.  Eyes: Conjunctivae are normal.  Neck: Normal range of motion. Neck supple.  Cardiovascular: Normal rate and regular rhythm.   Pulmonary/Chest: Effort normal and breath sounds normal.  Lymphadenopathy:    She has no cervical adenopathy.  Neurological: She is alert and oriented to person, place, and time.  Skin: Skin is warm and dry.  Psychiatric: She has a normal mood and affect. Her behavior is normal. Thought content normal.     BP 128/74 mmHg  Pulse 89  Temp(Src) 98.4 F (36.9 C) (Oral)  Resp 18  Ht 5' 4.5" (1.638 m)  Wt 180 lb (81.647 kg)  BMI 30.43 kg/m2  SpO2 99%  LMP 06/23/2015     Assessment & Plan:     1. Viral URI Cough, congestion, sore throat, and sinus pressure likely viral etiology. Given Atrovent to help open airways and reduce congestion. Encouraged to continue Guaifenesin to thin secretions. Tessalon prescription given for daytime cough. Encouraged to drink plenty of fluids and rest. Patient will call if symptoms do not resolve by the weekend.  - ipratropium (ATROVENT)  0.03 % nasal spray; Place 2 sprays into both nostrils 2 (two) times daily.  Dispense: 30 mL; Refill: 0 - benzonatate (TESSALON) 100 MG capsule; Take 1-2 capsules (100-200 mg total) by mouth 3 (three) times daily as needed for cough.  Dispense: 40 capsule; Refill: 0  2. Cough Prescription of Hydrocodone-Chlorpheniramine given for nighttime.  - Hydrocodone-Chlorpheniramine 5-4 MG/5ML SOLN; Take 5 mLs by mouth at bedtime as needed (cough).  Dispense: 100 mL; Refill: 0   Brittnay Pigman D. Race, PA-S Physician Assistant  Student [facility]

## 2015-07-13 ENCOUNTER — Telehealth: Payer: Self-pay | Admitting: Physician Assistant

## 2015-07-13 DIAGNOSIS — J0100 Acute maxillary sinusitis, unspecified: Secondary | ICD-10-CM

## 2015-07-13 MED ORDER — CEFDINIR 300 MG PO CAPS: 600.0000 mg | ORAL_CAPSULE | Freq: Every day | ORAL | Status: AC

## 2015-07-13 NOTE — Telephone Encounter (Signed)
Meds ordered this encounter  Medications  . cefdinir (OMNICEF) 300 MG capsule    Sig: Take 2 capsules (600 mg total) by mouth daily.    Dispense:  20 capsule    Refill:  0    Order Specific Question:  Supervising Provider    Answer:  DOOLITTLE, ROBERT P [[id number]]    

## 2015-07-31 ENCOUNTER — Ambulatory Visit (INDEPENDENT_AMBULATORY_CARE_PROVIDER_SITE_OTHER): Payer: BLUE CROSS/BLUE SHIELD | Admitting: Physician Assistant

## 2015-07-31 ENCOUNTER — Ambulatory Visit (INDEPENDENT_AMBULATORY_CARE_PROVIDER_SITE_OTHER): Payer: BLUE CROSS/BLUE SHIELD

## 2015-07-31 ENCOUNTER — Telehealth: Payer: Self-pay | Admitting: Physician Assistant

## 2015-07-31 VITALS — BP 124/80 | HR 75 | Temp 98.0°F | Resp 18 | Ht 64.5 in | Wt 179.0 lb

## 2015-07-31 DIAGNOSIS — R05 Cough: Secondary | ICD-10-CM

## 2015-07-31 DIAGNOSIS — R059 Cough, unspecified: Secondary | ICD-10-CM

## 2015-07-31 MED ORDER — ALBUTEROL SULFATE (2.5 MG/3ML) 0.083% IN NEBU
2.5000 mg | INHALATION_SOLUTION | Freq: Once | RESPIRATORY_TRACT | Status: AC
  Administered 2015-07-31: 2.5 mg via RESPIRATORY_TRACT

## 2015-07-31 MED ORDER — IPRATROPIUM BROMIDE 0.02 % IN SOLN
0.5000 mg | Freq: Once | RESPIRATORY_TRACT | Status: AC
  Administered 2015-07-31: 0.5 mg via RESPIRATORY_TRACT

## 2015-07-31 MED ORDER — PREDNISONE 20 MG PO TABS: ORAL_TABLET | ORAL | Status: DC

## 2015-07-31 MED ORDER — ALBUTEROL SULFATE HFA 108 (90 BASE) MCG/ACT IN AERS: 2.0000 | INHALATION_SPRAY | RESPIRATORY_TRACT | Status: DC | PRN

## 2015-07-31 NOTE — Progress Notes (Signed)
Subjective:     Patient ID: [name], female   DOB: Mar 12, 1976, 39 y.o.   MRN: [id number] PCP: No primary care provider on file.  Chief Complaint  Patient presents with  . Follow-up    URI  . Cough    no better   . Chest Pain    tightness with cough   . Back Pain    HPI Patient presents today for re-evaluation of URI symptoms. She was seen at this clinic on 09/12 for a viral URI and cough. She was given Tessalon for daytime cough and Hydrocodone-Chlorpheniramine for nighttime cough. She was also given Atrovent and counseled to continue Mucinex. The patient called in on 09/13 because her symptoms were not improving. She was prescribed Omnicef at that time for sinusitis.   She completed the antibiotics on 09/23. The antibiotic helped to produce a lot of sputum that she was able to cough up. Now, she has been left with a tight, dry cough that will not resolve. It is causing pain in her chest and between her shoulder blades that is present all of the time but is worse when she coughs. Tessalon does not help. She has not been taking the Hydrocodone cough syrup because she does not like taking codeine products. Robitussin does help, however it causes her to sleep too much and she is unable to wake up for half of the day the next day after taking it. She has been using her son's nebulizer at home to improve the chest tightness/cough with minimal relief.   One child at home has been diagnosed with Croup, on day 4 of Prednisone and not any better.   The only new symptom she has been experiencing is diarrhea which began yesterday. No nausea or vomiting. No abdominal pain. No urinary symptoms.  She has been measuring her temperature at home and states that she has not had a reading over 100.    Review of Systems  Constitutional: Negative for fever, chills and appetite change.  HENT: Negative for congestion.   Respiratory: Positive for chest tightness. Negative for shortness of breath.    Cardiovascular: Negative for chest pain.  Gastrointestinal: Positive for diarrhea. Negative for nausea, vomiting and constipation.  Genitourinary: Negative for dysuria, urgency and frequency.  See HPI  There are no active problems to display for this patient.   Prior to Admission medications   Medication Sig Start Date End Date Taking? Authorizing Provider  ALBUTEROL SULFATE HFA IN Inhale into the lungs.   Yes Historical Provider, MD  GuaiFENesin (MUCINEX PO) Take by mouth.   Yes Historical Provider, MD  benzonatate (TESSALON) 100 MG capsule Take 1-2 capsules (100-200 mg total) by mouth 3 (three) times daily as needed for cough. Patient not taking: Reported on 07/31/2015 07/12/15   Porfirio Oar, PA-C  Hydrocodone-Chlorpheniramine 5-4 MG/5ML SOLN Take 5 mLs by mouth at bedtime as needed (cough). Patient not taking: Reported on 07/31/2015 07/12/15   Chelle Jeffery, PA-C  ipratropium (ATROVENT) 0.03 % nasal spray Place 2 sprays into both nostrils 2 (two) times daily. Patient not taking: Reported on 07/31/2015 07/12/15   Porfirio Oar, PA-C    Allergies  Allergen Reactions  . Antihistamines, Diphenhydramine-Type Other (See Comments)    Oral antihistamines makes PT depressed  . Biaxin [Clarithromycin] Other (See Comments)    Migraines       Objective:  Physical Exam  Constitutional: She is oriented to person, place, and time. She appears well-developed and well-nourished.  HENT:  Head:  Normocephalic and atraumatic.  Eyes: Conjunctivae are normal. Pupils are equal, round, and reactive to light.  Neck: Normal range of motion. Neck supple.  Cardiovascular: Normal rate and regular rhythm.   Pulmonary/Chest: Effort normal and breath sounds normal. No respiratory distress. She has no wheezes.  Coughs during exam, non-productive.  Abdominal: Soft. Bowel sounds are normal. She exhibits no distension and no mass. There is no tenderness. There is no rebound and no guarding.  Neurological:  She is alert and oriented to person, place, and time.  Skin: Skin is warm and dry.  Psychiatric: She has a normal mood and affect. Her behavior is normal. Thought content normal.     BP 124/80 mmHg  Pulse 75  Temp(Src) 98 F (36.7 C) (Oral)  Resp 18  Ht 5' 4.5" (1.638 m)  Wt 179 lb (81.194 kg)  BMI 30.26 kg/m2  SpO2 99%  LMP 07/19/2015   CXR: No acute cardiopulmonary process.  Assessment & Plan:  1. Cough CXR shows no acute cardiopulmonary process. Suspect the cough is from reactive airway disease. Prednisone taper. Albuterol inhaler as needed for chest tightness/shortness of breath.  - DG Chest 2 View; Future - albuterol (PROVENTIL) (2.5 MG/3ML) 0.083% nebulizer solution 2.5 mg; Take 3 mLs (2.5 mg total) by nebulization once. - ipratropium (ATROVENT) nebulizer solution 0.5 mg; Take 2.5 mLs (0.5 mg total) by nebulization once. - albuterol (PROVENTIL HFA;VENTOLIN HFA) 108 (90 BASE) MCG/ACT inhaler; Inhale 2 puffs into the lungs every 4 (four) hours as needed for wheezing or shortness of breath (cough, shortness of breath or wheezing.).  Dispense: 1 Inhaler; Refill: 1 - predniSONE (DELTASONE) 20 MG tablet; Take 3 PO QAM x3days, 2 PO QAM x3days, 1 PO QAM x3days  Dispense: 18 tablet; Refill: 0   Follow-up as needed.   Niki Cosman D. Race, PA-S Physician Assistant Student [facility]

## 2015-07-31 NOTE — Telephone Encounter (Signed)
Advised patient of normal CXR reading. Proceed with plan.

## 2015-07-31 NOTE — Progress Notes (Signed)
Patient ID: [name], female    DOB: 10-18-1976, 39 y.o.   MRN: [id number]  PCP: No primary care provider on file.  Subjective:   Chief Complaint  Patient presents with  . Follow-up    URI  . Cough    no better   . Chest Pain    tightness with cough   . Back Pain    HPI Presents for evaluation of cough.   She was seen at this clinic on 09/12 for a viral URI and cough. She was given Tessalon for daytime cough and Hydrocodone-Chlorpheniramine for nighttime cough. She was also given Atrovent and counseled to continue Mucinex. The patient called in on 09/13 because her symptoms were not improving. She was prescribed Omnicef for sinusitis.   She completed the antibiotics on 09/23. The antibiotic helped to produce a lot of sputum that she was able to cough up. Now, she has been left with a tight, dry cough that will not resolve. It is causing pain in her chest and between her shoulder blades that is present all of the time but is worse when she coughs. Tessalon does not help. She has not been taking the Hydrocodone cough syrup because she does not like taking codeine products. Robitussin does help, however it causes her to sleep too much and she is unable to wake up for half of the day the next day after taking it. She has been using her son's nebulizer at home to improve the chest tightness/cough with minimal relief.   One child at home has been diagnosed with Croup, on day 4 of Prednisone and not any better. He's being re-evaluated today with Peds for possible pneumonia (he had pneumonia 3 times last year).  The only new symptom she has been experiencing is diarrhea which began yesterday. No nausea or vomiting. No abdominal pain. No urinary symptoms.  She has been measuring her temperature at home and states that she has not had a reading over 100.    Review of Systems  Constitutional: Positive for fatigue. Negative for fever and chills.  HENT: Negative for congestion, ear  pain, nosebleeds, postnasal drip, rhinorrhea, sinus pressure, sneezing, sore throat and trouble swallowing.   Eyes: Negative for visual disturbance.  Respiratory: Positive for cough and chest tightness. Negative for apnea, choking, shortness of breath, wheezing and stridor.   Cardiovascular: Positive for chest pain (with cough). Negative for palpitations and leg swelling.  Gastrointestinal: Positive for diarrhea (yesterday). Negative for nausea, vomiting and abdominal pain.  Musculoskeletal: Positive for back pain (with coughing). Negative for arthralgias.  Skin: Negative for rash.       There are no active problems to display for this patient.    Prior to Admission medications   Medication Sig Start Date End Date Taking? Authorizing Provider  ALBUTEROL SULFATE HFA IN Inhale into the lungs.   Yes Historical Provider, MD  GuaiFENesin (MUCINEX PO) Take by mouth.   Yes Historical Provider, MD  benzonatate (TESSALON) 100 MG capsule Take 1-2 capsules (100-200 mg total) by mouth 3 (three) times daily as needed for cough. Patient not taking: Reported on 07/31/2015 07/12/15   Porfirio Oar, PA-C  Hydrocodone-Chlorpheniramine 5-4 MG/5ML SOLN Take 5 mLs by mouth at bedtime as needed (cough). Patient not taking: Reported on 07/31/2015 07/12/15   Asaph Serena, PA-C  ipratropium (ATROVENT) 0.03 % nasal spray Place 2 sprays into both nostrils 2 (two) times daily. Patient not taking: Reported on 07/31/2015 07/12/15   Porfirio Oar, PA-C  Allergies  Allergen Reactions  . Antihistamines, Diphenhydramine-Type Other (See Comments)    Oral antihistamines makes PT depressed  . Biaxin [Clarithromycin] Other (See Comments)    Migraines       Objective:  Physical Exam  Constitutional: She is oriented to person, place, and time. She appears well-developed and well-nourished. She is active and cooperative. No distress.  BP 124/80 mmHg  Pulse 75  Temp(Src) 98 F (36.7 C) (Oral)  Resp 18  Ht 5'  4.5" (1.638 m)  Wt 179 lb (81.194 kg)  BMI 30.26 kg/m2  SpO2 99%  LMP 07/19/2015  HENT:  Head: Normocephalic and atraumatic.  Right Ear: Hearing normal.  Left Ear: Hearing normal.  Eyes: Conjunctivae are normal. No scleral icterus.  Neck: Normal range of motion. Neck supple. No thyromegaly present.  Cardiovascular: Normal rate, regular rhythm and normal heart sounds.   Pulses:      Radial pulses are 2+ on the right side, and 2+ on the left side.  Pulmonary/Chest: Effort normal and breath sounds normal.  Coughs repeatedly during examination. Non-productive.   Lymphadenopathy:       Head (right side): No tonsillar, no preauricular, no posterior auricular and no occipital adenopathy present.       Head (left side): No tonsillar, no preauricular, no posterior auricular and no occipital adenopathy present.    She has no cervical adenopathy.       Right: No supraclavicular adenopathy present.       Left: No supraclavicular adenopathy present.  Neurological: She is alert and oriented to person, place, and time. No sensory deficit.  Skin: Skin is warm, dry and intact. No rash noted. No cyanosis or erythema. Nails show no clubbing.  Psychiatric: She has a normal mood and affect.      CXR:  Increased markings RML but no discrete infiltrate. No other acute findings. Radiologist Reading: IMPRESSION: No acute cardiopulmonary process.     Assessment & Plan:   1. Cough Persistent cough, s/p sinusitis. Symptoms likely represent reactive airways, despite absence of wheezing, as she was subjectively improved post-neb treatment. Prednisone. Albuterol. Anticipatory guidance. - DG Chest 2 View; Future - albuterol (PROVENTIL) (2.5 MG/3ML) 0.083% nebulizer solution 2.5 mg; Take 3 mLs (2.5 mg total) by nebulization once. - ipratropium (ATROVENT) nebulizer solution 0.5 mg; Take 2.5 mLs (0.5 mg total) by nebulization once. - albuterol (PROVENTIL HFA;VENTOLIN HFA) 108 (90 BASE) MCG/ACT inhaler; Inhale  2 puffs into the lungs every 4 (four) hours as needed for wheezing or shortness of breath (cough, shortness of breath or wheezing.).  Dispense: 1 Inhaler; Refill: 1 - predniSONE (DELTASONE) 20 MG tablet; Take 3 PO QAM x3days, 2 PO QAM x3days, 1 PO QAM x3days  Dispense: 18 tablet; Refill: 0   Fernande Bras, PA-C Physician Assistant-Certified [facility]

## 2015-08-02 ENCOUNTER — Telehealth: Payer: Self-pay | Admitting: Physician Assistant

## 2015-08-02 MED ORDER — MOXIFLOXACIN HCL 400 MG PO TABS: 400.0000 mg | ORAL_TABLET | Freq: Every day | ORAL | Status: DC

## 2015-08-02 NOTE — Telephone Encounter (Signed)
Chart reviewed.  Meds ordered this encounter  Medications  . moxifloxacin (AVELOX) 400 MG tablet    Sig: Take 1 tablet (400 mg total) by mouth daily.    Dispense:  10 tablet    Refill:  0    Order Specific Question:  Supervising Provider    Answer:  DOOLITTLE, ROBERT P [[id number]]

## 2015-08-03 ENCOUNTER — Telehealth: Payer: Self-pay | Admitting: Physician Assistant

## 2015-08-03 MED ORDER — CEFDINIR 300 MG PO CAPS: 600.0000 mg | ORAL_CAPSULE | Freq: Every day | ORAL | Status: AC

## 2015-08-03 NOTE — Telephone Encounter (Signed)
After reading the potential adverse effects, the patient doesn't want to take a fluoroquinolone due to the risk of tendon rupture.  Wants to retry Omnicef.  Meds ordered this encounter  Medications  . cefdinir (OMNICEF) 300 MG capsule    Sig: Take 2 capsules (600 mg total) by mouth daily.    Dispense:  20 capsule    Refill:  0    Order Specific Question:  Supervising Provider    Answer:  DOOLITTLE, ROBERT P [[id number]]

## 2015-09-01 ENCOUNTER — Ambulatory Visit (INDEPENDENT_AMBULATORY_CARE_PROVIDER_SITE_OTHER): Payer: BLUE CROSS/BLUE SHIELD

## 2015-09-01 ENCOUNTER — Ambulatory Visit (INDEPENDENT_AMBULATORY_CARE_PROVIDER_SITE_OTHER): Payer: BLUE CROSS/BLUE SHIELD | Admitting: Internal Medicine

## 2015-09-01 VITALS — BP 124/74 | HR 92 | Temp 98.5°F | Resp 17 | Ht 64.5 in | Wt 175.0 lb

## 2015-09-01 DIAGNOSIS — R059 Cough, unspecified: Secondary | ICD-10-CM

## 2015-09-01 DIAGNOSIS — R509 Fever, unspecified: Secondary | ICD-10-CM

## 2015-09-01 DIAGNOSIS — R5383 Other fatigue: Secondary | ICD-10-CM

## 2015-09-01 DIAGNOSIS — J200 Acute bronchitis due to Mycoplasma pneumoniae: Secondary | ICD-10-CM

## 2015-09-01 DIAGNOSIS — R05 Cough: Secondary | ICD-10-CM | POA: Diagnosis not present

## 2015-09-01 LAB — POCT CBC
Granulocyte percent: 55 %G (ref 37–80)
HCT, POC: 42.7 % (ref 37.7–47.9)
Hemoglobin: 15.1 g/dL (ref 12.2–16.2)
Lymph, poc: 3 (ref 0.6–3.4)
MCH, POC: 29.7 pg (ref 27–31.2)
MCHC: 35.2 g/dL (ref 31.8–35.4)
MCV: 84.4 fL (ref 80–97)
MID (CBC): 0.7 (ref 0–0.9)
MPV: 6.9 fL (ref 0–99.8)
PLATELET COUNT, POC: 223 10*3/uL (ref 142–424)
POC GRANULOCYTE: 4.5 (ref 2–6.9)
POC LYMPH PERCENT: 36.7 %L (ref 10–50)
POC MID %: 8.3 % (ref 0–12)
RBC: 5.06 M/uL (ref 4.04–5.48)
RDW, POC: 12.7 %
WBC: 8.2 10*3/uL (ref 4.6–10.2)

## 2015-09-01 MED ORDER — HYDROCODONE-ACETAMINOPHEN 7.5-325 MG/15ML PO SOLN: 5.0000 mL | Freq: Four times a day (QID) | ORAL | Status: DC | PRN

## 2015-09-01 MED ORDER — AZITHROMYCIN 500 MG PO TABS: 500.0000 mg | ORAL_TABLET | Freq: Every day | ORAL | Status: DC

## 2015-09-01 MED ORDER — ALBUTEROL SULFATE HFA 108 (90 BASE) MCG/ACT IN AERS: 2.0000 | INHALATION_SPRAY | Freq: Four times a day (QID) | RESPIRATORY_TRACT | Status: DC | PRN

## 2015-09-01 NOTE — Patient Instructions (Addendum)
Acute Bronchitis °Bronchitis is inflammation of the airways that extend from the windpipe into the lungs (bronchi). The inflammation often causes mucus to develop. This leads to a cough, which is the most common symptom of bronchitis.  °In acute bronchitis, the condition usually develops suddenly and goes away over time, usually in a couple weeks. Smoking, allergies, and asthma can make bronchitis worse. Repeated episodes of bronchitis may cause further lung problems.  °CAUSES °Acute bronchitis is most often caused by the same virus that causes a cold. The virus can spread from person to person (contagious) through coughing, sneezing, and touching contaminated objects. °SIGNS AND SYMPTOMS  °· Cough.   °· Fever.   °· Coughing up mucus.   °· Body aches.   °· Chest congestion.   °· Chills.   °· Shortness of breath.   °· Sore throat.   °DIAGNOSIS  °Acute bronchitis is usually diagnosed through a physical exam. Your health care provider will also ask you questions about your medical history. Tests, such as chest X-rays, are sometimes done to rule out other conditions.  °TREATMENT  °Acute bronchitis usually goes away in a couple weeks. Oftentimes, no medical treatment is necessary. Medicines are sometimes given for relief of fever or cough. Antibiotic medicines are usually not needed but may be prescribed in certain situations. In some cases, an inhaler may be recommended to help reduce shortness of breath and control the cough. A cool mist vaporizer may also be used to help thin bronchial secretions and make it easier to clear the chest.  °HOME CARE INSTRUCTIONS °· Get plenty of rest.   °· Drink enough fluids to keep your urine clear or pale yellow (unless you have a medical condition that requires fluid restriction). Increasing fluids may help thin your respiratory secretions (sputum) and reduce chest congestion, and it will prevent dehydration.   °· Take medicines only as directed by your health care provider. °· If  you were prescribed an antibiotic medicine, finish it all even if you start to feel better. °· Avoid smoking and secondhand smoke. Exposure to cigarette smoke or irritating chemicals will make bronchitis worse. If you are a smoker, consider using nicotine gum or skin patches to help control withdrawal symptoms. Quitting smoking will help your lungs heal faster.   °· Reduce the chances of another bout of acute bronchitis by washing your hands frequently, avoiding people with cold symptoms, and trying not to touch your hands to your mouth, nose, or eyes.   °· Keep all follow-up visits as directed by your health care provider.   °SEEK MEDICAL CARE IF: °Your symptoms do not improve after 1 week of treatment.  °SEEK IMMEDIATE MEDICAL CARE IF: °· You develop an increased fever or chills.   °· You have chest pain.   °· You have severe shortness of breath. °· You have bloody sputum.   °· You develop dehydration. °· You faint or repeatedly feel like you are going to pass out. °· You develop repeated vomiting. °· You develop a severe headache. °MAKE SURE YOU:  °· Understand these instructions. °· Will watch your condition. °· Will get help right away if you are not doing well or get worse. °  °This information is not intended to replace advice given to you by your health care provider. Make sure you discuss any questions you have with your health care provider. °  °Document Released: 11/23/2004 Document Revised: 11/06/2014 Document Reviewed: 04/08/2013 °Elsevier Interactive Patient Education ©2016 [employer] ° °Cough, Adult °Coughing is a reflex that clears your throat and your airways. Coughing helps to heal and   protect your lungs. It is normal to cough occasionally, but a cough that happens with other symptoms or lasts a long time may be a sign of a condition that needs treatment. A cough may last only 2-3 weeks (acute), or it may last longer than 8 weeks (chronic). °CAUSES °Coughing is commonly caused by: °· Breathing  in substances that irritate your lungs. °· A viral or bacterial respiratory infection. °· Allergies. °· Asthma. °· Postnasal drip. °· Smoking. °· Acid backing up from the stomach into the esophagus (gastroesophageal reflux). °· Certain medicines. °· Chronic lung problems, including COPD (or rarely, lung cancer). °· Other medical conditions such as heart failure. °HOME CARE INSTRUCTIONS  °Pay attention to any changes in your symptoms. Take these actions to help with your discomfort: °· Take medicines only as told by your health care provider. °¨ If you were prescribed an antibiotic medicine, take it as told by your health care provider. Do not stop taking the antibiotic even if you start to feel better. °¨ Talk with your health care provider before you take a cough suppressant medicine. °· Drink enough fluid to keep your urine clear or pale yellow. °· If the air is dry, use a cold steam vaporizer or humidifier in your bedroom or your home to help loosen secretions. °· Avoid anything that causes you to cough at work or at home. °· If your cough is worse at night, try sleeping in a semi-upright position. °· Avoid cigarette smoke. If you smoke, quit smoking. If you need help quitting, ask your health care provider. °· Avoid caffeine. °· Avoid alcohol. °· Rest as needed. °SEEK MEDICAL CARE IF:  °· You have new symptoms. °· You cough up pus. °· Your cough does not get better after 2-3 weeks, or your cough gets worse. °· You cannot control your cough with suppressant medicines and you are losing sleep. °· You develop pain that is getting worse or pain that is not controlled with pain medicines. °· You have a fever. °· You have unexplained weight loss. °· You have night sweats. °SEEK IMMEDIATE MEDICAL CARE IF: °· You cough up blood. °· You have difficulty breathing. °· Your heartbeat is very fast. °  °This information is not intended to replace advice given to you by your health care provider. Make sure you discuss any  questions you have with your health care provider. °  °Document Released: 04/14/2011 Document Revised: 07/07/2015 Document Reviewed: 12/23/2014 °Elsevier Interactive Patient Education ©2016 [employer] ° °

## 2015-09-01 NOTE — Progress Notes (Signed)
Patient ID: [name], female   DOB: 1976/06/28, [age] y.o.   MRN: [id number]   09/01/2015 at 1:15 PM  [name] / DOB: 1976/06/28 / MRN: [id number]  Problem list reviewed and updated by me where necessary.   SUBJECTIVE  [name] is a 39 y.o. ill appearing female presenting for the chief complaint of cough and fever for 2-3 weeks.  Has been seen here a month ago for similar illness and never fully recovered. She went to ENT who rec PPI and a cxr. She is not sob, no anginaor cad sxs. She continues with cough and low grade fever daily. Avelox caused tendon pain. She has not received treatment for mycoplasma.   She  has a past medical history of Allergy and Asthma.    Medications reviewed and updated by myself where necessary, and exist elsewhere in the encounter.   Ms. [name] is allergic to antihistamines, diphenhydramine-type and biaxin. She  reports that she has never smoked. She has never used smokeless tobacco. She reports that she drinks alcohol. She reports that she does not use illicit drugs. She  reports that she currently engages in sexual activity and has had female partners. The patient  has past surgical history that includes Cesarean section and Nasal sinus surgery.  Her family history includes Asthma in her son; Cancer in her maternal grandfather, paternal grandfather, and paternal grandmother; Congestive Heart Failure in her maternal grandmother; Dementia in her maternal grandfather; Heart attack in her father; Hypertension in her father and mother.  Review of Systems  Constitutional: Positive for fever, chills and malaise/fatigue. Negative for diaphoresis.  HENT: Negative for sore throat.   Respiratory: Positive for cough, sputum production and wheezing. Negative for shortness of breath.   Cardiovascular: Negative.   Gastrointestinal: Negative for heartburn.  Musculoskeletal: Positive for myalgias.  Skin: Negative for rash.  Neurological: Positive for  headaches.  Psychiatric/Behavioral: Negative.     OBJECTIVE  Her  height is 5' 4.5" (1.638 m) and weight is 175 lb (79.379 kg). Her oral temperature is 98.5 F (36.9 C). Her blood pressure is 124/74 and her pulse is 92. Her respiration is 17 and oxygen saturation is 98%.  The patient's body mass index is 29.59 kg/(m^2).  Physical Exam  Constitutional: She is oriented to person, place, and time. She appears well-developed and well-nourished. No distress.  HENT:  Head: Normocephalic.  Right Ear: External ear normal.  Left Ear: External ear normal.  Mouth/Throat: Oropharynx is clear and moist.  Eyes: Conjunctivae and EOM are normal.  Neck: Normal range of motion. Neck supple.  Respiratory: Effort normal. No tachypnea. No respiratory distress. She has no decreased breath sounds. She has no wheezes. She has rhonchi. She has no rales. She exhibits tenderness.  Musculoskeletal: Normal range of motion.  Neurological: She is alert and oriented to person, place, and time. No cranial nerve deficit. She exhibits normal muscle tone. Coordination normal.  Skin: No rash noted. She is not diaphoretic.  Psychiatric: She has a normal mood and affect. Her behavior is normal.    Results for orders placed or performed in visit on 09/01/15 (from the past 24 hour(s))  POCT CBC     Status: None   Collection Time: 09/01/15  1:10 PM  Result Value Ref Range   WBC 8.2 4.6 - 10.2 K/uL   Lymph, poc 3.0 0.6 - 3.4   POC LYMPH PERCENT 36.7 10 - 50 %L   MID (cbc) 0.7 0 - 0.9  POC MID % 8.3 0 - 12 %M   POC Granulocyte 4.5 2 - 6.9   Granulocyte percent 55.0 37 - 80 %G   RBC 5.06 4.04 - 5.48 M/uL   Hemoglobin 15.1 12.2 - 16.2 g/dL   HCT, POC 78.2 95.6 - 47.9 %   MCV 84.4 80 - 97 fL   MCH, POC 29.7 27 - 31.2 pg   MCHC 35.2 31.8 - 35.4 g/dL   RDW, POC 21.3 %   Platelet Count, POC 223 142 - 424 K/uL   MPV 6.9 0 - 99.8 fL    [facility] reading (PRIMARY) by  Dr.Minola Guin no infiltrate seen, normal cxr   ASSESSMENT &  PLAN  [name] was seen today for cough and chest pressure.  Diagnoses and all orders for this visit:  Cough -     POCT CBC -     DG Chest 2 View; Future -     azithromycin (ZITHROMAX) 500 MG tablet; Take 1 tablet (500 mg total) by mouth daily. -     albuterol (PROVENTIL HFA;VENTOLIN HFA) 108 (90 BASE) MCG/ACT inhaler; Inhale 2 puffs into the lungs every 6 (six) hours as needed for wheezing or shortness of breath. -     HYDROcodone-acetaminophen (HYCET) 7.5-325 mg/15 ml solution; Take 5 mLs by mouth every 6 (six) hours as needed (or cough).  Fever, unspecified -     POCT CBC -     DG Chest 2 View; Future -     azithromycin (ZITHROMAX) 500 MG tablet; Take 1 tablet (500 mg total) by mouth daily. -     albuterol (PROVENTIL HFA;VENTOLIN HFA) 108 (90 BASE) MCG/ACT inhaler; Inhale 2 puffs into the lungs every 6 (six) hours as needed for wheezing or shortness of breath. -     HYDROcodone-acetaminophen (HYCET) 7.5-325 mg/15 ml solution; Take 5 mLs by mouth every 6 (six) hours as needed (or cough).  Other fatigue -     POCT CBC -     DG Chest 2 View; Future -     azithromycin (ZITHROMAX) 500 MG tablet; Take 1 tablet (500 mg total) by mouth daily. -     albuterol (PROVENTIL HFA;VENTOLIN HFA) 108 (90 BASE) MCG/ACT inhaler; Inhale 2 puffs into the lungs every 6 (six) hours as needed for wheezing or shortness of breath. -     HYDROcodone-acetaminophen (HYCET) 7.5-325 mg/15 ml solution; Take 5 mLs by mouth every 6 (six) hours as needed (or cough).  Acute bronchitis due to Mycoplasma pneumoniae -     POCT CBC -     DG Chest 2 View; Future -     azithromycin (ZITHROMAX) 500 MG tablet; Take 1 tablet (500 mg total) by mouth daily. -     albuterol (PROVENTIL HFA;VENTOLIN HFA) 108 (90 BASE) MCG/ACT inhaler; Inhale 2 puffs into the lungs every 6 (six) hours as needed for wheezing or shortness of breath. -     HYDROcodone-acetaminophen (HYCET) 7.5-325 mg/15 ml solution; Take 5 mLs by mouth every 6 (six)  hours as needed (or cough).

## 2015-09-05 ENCOUNTER — Ambulatory Visit (INDEPENDENT_AMBULATORY_CARE_PROVIDER_SITE_OTHER): Payer: BLUE CROSS/BLUE SHIELD | Admitting: Family Medicine

## 2015-09-05 VITALS — BP 120/80 | HR 99 | Temp 97.8°F | Resp 18 | Ht 64.0 in | Wt 174.4 lb

## 2015-09-05 DIAGNOSIS — J209 Acute bronchitis, unspecified: Secondary | ICD-10-CM | POA: Diagnosis not present

## 2015-09-05 MED ORDER — METHYLPREDNISOLONE 4 MG PO TABS: ORAL_TABLET | ORAL | Status: DC

## 2015-09-05 MED ORDER — AZITHROMYCIN 500 MG PO TABS: 500.0000 mg | ORAL_TABLET | Freq: Every day | ORAL | Status: DC

## 2015-09-05 NOTE — Progress Notes (Signed)
 @[facility] @  This chart was scribed for Linda SidleKurt Lauenstein, MD by [name], ED Scribe. This patient was seen in room 3 and the patient's care was started at 12:05 PM.  Patient ID: [name] MRN: [id number], DOB: 22-Dec-1975, 39 y.o. Date of Encounter: 09/05/2015, 12:05 PM  Primary Physician: No primary care provider on file.  Chief Complaint:  Chief Complaint  Patient presents with   Follow-up    same symptom  of last visit (cough)    HPI: 39 y.o. year old female with history below presents for a follow up of dry cough for several weeks. Was seen her 4 days ago for cough. Prescribed zithromax, albuterol and hycet. CXR was normal. Pt states she started feeling better yesterday but cough has persisted. She has not been able to sleep due to cough. She had to use her sons nebulizer with some improvement. She has also been using robitussin, states Hycet makes her hyper and could not sleep. She has hx of illness induced asthma.    Past Medical History  Diagnosis Date   Allergy    Asthma      Home Meds: Prior to Admission medications   Medication Sig Start Date End Date Taking? Authorizing Provider  albuterol (PROVENTIL HFA;VENTOLIN HFA) 108 (90 BASE) MCG/ACT inhaler Inhale 2 puffs into the lungs every 4 (four) hours as needed for wheezing or shortness of breath (cough, shortness of breath or wheezing.). 07/31/15  Yes Linda Jeffery, PA-C  albuterol (PROVENTIL HFA;VENTOLIN HFA) 108 (90 BASE) MCG/ACT inhaler Inhale 2 puffs into the lungs every 6 (six) hours as needed for wheezing or shortness of breath. 09/01/15  Yes Linda Albeehris W Guest, MD  ALBUTEROL SULFATE HFA IN Inhale into the lungs.   Yes Historical Provider, MD  azithromycin (ZITHROMAX) 500 MG tablet Take 1 tablet (500 mg total) by mouth daily. 09/01/15  Yes Linda Albeehris W Guest, MD  benzonatate (TESSALON) 100 MG capsule Take 1-2 capsules (100-200 mg total) by mouth 3 (three) times daily as needed for cough. 07/12/15  Yes Linda Jeffery, PA-C   GuaiFENesin (MUCINEX PO) Take by mouth.   Yes Historical Provider, MD  HYDROcodone-acetaminophen (HYCET) 7.5-325 mg/15 ml solution Take 5 mLs by mouth every 6 (six) hours as needed (or cough). 09/01/15  Yes Linda Albeehris W Guest, MD  Hydrocodone-Chlorpheniramine 5-4 MG/5ML SOLN Take 5 mLs by mouth at bedtime as needed (cough). 07/12/15  Yes Linda Jeffery, PA-C  ipratropium (ATROVENT) 0.03 % nasal spray Place 2 sprays into both nostrils 2 (two) times daily. 07/12/15  Yes Linda Jeffery, PA-C  predniSONE (DELTASONE) 20 MG tablet Take 3 PO QAM x3days, 2 PO QAM x3days, 1 PO QAM x3days 07/31/15  Yes Linda Jeffery, PA-C    Allergies:  Allergies  Allergen Reactions   Antihistamines, Diphenhydramine-Type Other (See Comments)    Oral antihistamines makes PT depressed   Biaxin [Clarithromycin] Other (See Comments)    Migraines, she takes zpak with no problems    Social History   Social History   Marital Status: Married    Spouse Name: [name]   Number of Children: 2   Years of Education: N/A   Occupational History   Not on file.   Social History Main Topics   Smoking status: Never Smoker    Smokeless tobacco: Never Used   Alcohol Use: Yes     Comment: 7-8/week   Drug Use: No   Sexual Activity:    Partners: Male     Comment: partner s/p vasectomy   Other Topics Concern  Not on file   Social History Narrative   Lives with her husband and their two sons, [name] and [name].     Review of Systems: Constitutional: negative for chills, fever, night sweats, weight changes, or fatigue  HEENT: negative for vision changes, hearing loss, congestion, rhinorrhea, ST, epistaxis, or sinus pressure Cardiovascular: negative for chest pain or palpitations Respiratory: negative for hemoptysis, wheezing, shortness of breath. Abdominal: negative for abdominal pain, nausea, vomiting, diarrhea, or constipation Dermatological: negative for rash Neurologic: negative for headache, dizziness, or  syncope All other systems reviewed and are otherwise negative with the exception to those above and in the HPI.   Physical Exam: Blood pressure 120/80, pulse 99, temperature 97.8 F (36.6 C), temperature source Oral, resp. rate 18, height  (1.626 m), weight 174 lb 6.4 oz (79.107 kg), last menstrual period 08/13/2015, SpO2 99 %., Body mass index is 29.92 kg/(m^2). General: Well developed, well nourished, in no acute distress. Head: Normocephalic, atraumatic, eyes without discharge, sclera non-icteric, nares are without discharge. Bilateral auditory canals clear, TM's are without perforation, pearly grey and translucent with reflective cone of light bilaterally. Oral cavity moist, posterior pharynx without exudate, erythema, peritonsillar abscess, or post nasal drip.  Neck: Supple. No thyromegaly. Full ROM. No lymphadenopathy. Lungs: Clear bilaterally to auscultation without wheezes, rales, or rhonchi. Breathing is unlabored. Heart: RRR with S1 S2. No murmurs, rubs, or gallops appreciated. Abdomen: Soft, non-tender, non-distended with normoactive bowel sounds. No hepatomegaly. No rebound/guarding. No obvious abdominal masses. Msk:  Strength and tone normal for age. Extremities/Skin: Warm and dry. No clubbing or cyanosis. No edema. No rashes or suspicious lesions. Neuro: Alert and oriented X 3. Moves all extremities spontaneously. Gait is normal. CNII-XII grossly in tact. Psych:  Responds to questions appropriately with a normal affect.    ASSESSMENT AND PLAN:  39 y.o. year old female with persistent bronchitis  This chart was scribed in my presence and reviewed by me personally.    ICD-9-CM ICD-10-CM   1. Acute bronchitis, unspecified organism 466.0 J20.9 methylPREDNISolone (MEDROL) 4 MG tablet      Signed, Linda Sidle, MD 09/05/2015 12:05 PM

## 2015-09-05 NOTE — Patient Instructions (Signed)

## 2015-11-29 ENCOUNTER — Ambulatory Visit (INDEPENDENT_AMBULATORY_CARE_PROVIDER_SITE_OTHER): Payer: BLUE CROSS/BLUE SHIELD | Admitting: Family Medicine

## 2015-11-29 VITALS — BP 124/90 | HR 75 | Temp 98.4°F | Resp 16 | Ht 64.0 in | Wt 178.0 lb

## 2015-11-29 DIAGNOSIS — J0110 Acute frontal sinusitis, unspecified: Secondary | ICD-10-CM

## 2015-11-29 DIAGNOSIS — IMO0001 Reserved for inherently not codable concepts without codable children: Secondary | ICD-10-CM

## 2015-11-29 DIAGNOSIS — R030 Elevated blood-pressure reading, without diagnosis of hypertension: Secondary | ICD-10-CM | POA: Diagnosis not present

## 2015-11-29 MED ORDER — AMOXICILLIN 500 MG PO CAPS: 1000.0000 mg | ORAL_CAPSULE | Freq: Two times a day (BID) | ORAL | Status: DC

## 2015-11-29 NOTE — Progress Notes (Signed)
[facility] [address], [place] [address] [phone]  Date:  11/29/2015   Name:  [name]   DOB:  1976-01-18   MRN:  [id number]  PCP:  No primary care provider on file.    Chief Complaint: Sinusitis   History of Present Illness:  [name] is a 40 y.o. very pleasant female patient who presents with the following:  She noted onset of a URI about 12 days ago- the sx seemed to settle in her sinuses about one week ago. She will cough when she lies down but this seems due to PND Her sx started with a ST but this is now better.  Some ear discomfort No GI symptoms She is generally in good health No wheezing She is not using her albuterol Her son had a cold recently but he got better  Temp of 100 this am at home No cold products She did not sleep well last night Notes that her DBP is higher than usual- she is a bit concerned about this as her BP is usually 120/ 70- 80  There are no active problems to display for this patient.   Past Medical History  Diagnosis Date  . Allergy   . Asthma     Past Surgical History  Procedure Laterality Date  . Cesarean section    . Nasal sinus surgery      Social History  Substance Use Topics  . Smoking status: Never Smoker   . Smokeless tobacco: Never Used  . Alcohol Use: Yes     Comment: 7-8/week    Family History  Problem Relation Age of Onset  . Hypertension Mother   . Hypertension Father   . Heart attack Father   . Congestive Heart Failure Maternal Grandmother   . Cancer Maternal Grandfather   . Dementia Maternal Grandfather   . Cancer Paternal Grandmother   . Cancer Paternal Grandfather   . Asthma Son     Allergies  Allergen Reactions  . Antihistamines, Diphenhydramine-Type Other (See Comments)    Oral antihistamines makes PT depressed  . Biaxin [Clarithromycin] Other (See Comments)    Migraines, she takes zpak with no problems    Medication list has been reviewed and  updated.  Current Outpatient Prescriptions on File Prior to Visit  Medication Sig Dispense Refill  . albuterol (PROVENTIL HFA;VENTOLIN HFA) 108 (90 BASE) MCG/ACT inhaler Inhale 2 puffs into the lungs every 4 (four) hours as needed for wheezing or shortness of breath (cough, shortness of breath or wheezing.). (Patient not taking: Reported on 11/29/2015) 1 Inhaler 1  . albuterol (PROVENTIL HFA;VENTOLIN HFA) 108 (90 BASE) MCG/ACT inhaler Inhale 2 puffs into the lungs every 6 (six) hours as needed for wheezing or shortness of breath. (Patient not taking: Reported on 11/29/2015) 1 Inhaler 0  . ALBUTEROL SULFATE HFA IN Inhale into the lungs. Reported on 11/29/2015    . azithromycin (ZITHROMAX) 500 MG tablet Take 1 tablet (500 mg total) by mouth daily. (Patient not taking: Reported on 11/29/2015) 3 tablet 0  . benzonatate (TESSALON) 100 MG capsule Take 1-2 capsules (100-200 mg total) by mouth 3 (three) times daily as needed for cough. (Patient not taking: Reported on 11/29/2015) 40 capsule 0  . GuaiFENesin (MUCINEX PO) Take by mouth. Reported on 11/29/2015    . ipratropium (ATROVENT) 0.03 % nasal spray Place 2 sprays into both nostrils 2 (two) times daily. (Patient not taking: Reported on 11/29/2015) 30 mL 0  . methylPREDNISolone (  MEDROL) 4 MG tablet 6-5-4-3-2-1- (Patient not taking: Reported on 11/29/2015) 21 tablet 0   No current facility-administered medications on file prior to visit.    Review of Systems:  As per HPI- otherwise negative.   Physical Examination: Filed Vitals:   11/29/15 0940  BP: 124/90  Pulse: 75  Temp: 98.4 F (36.9 C)  Resp: 16   Filed Vitals:   11/29/15 0940  Height:  (1.626 m)  Weight: 178 lb (80.74 kg)   Body mass index is 30.54 kg/(m^2). Ideal Body Weight: Weight in (lb) to have BMI = 25: 145.3  GEN: WDWN, NAD, Non-toxic, A & O x 3, overweight, looks well HEENT: Atraumatic, Normocephalic. Neck supple. No masses, No LAD.  Bilateral TM wnl, oropharynx normal.   PEERL,EOMI.   Frontal sinuses are tender to percussion Ears and Nose: No external deformity. CV: RRR, No M/G/R. No JVD. No thrill. No extra heart sounds. PULM: CTA B, no wheezes, crackles, rhonchi. No retractions. No resp. distress. No accessory muscle use. EXTR: No c/c/e NEURO Normal gait.  PSYCH: Normally interactive. Conversant. Not depressed or anxious appearing.  Calm demeanor.   Assessment and Plan: Acute non-recurrent frontal sinusitis - Plan: amoxicillin (AMOXIL) 500 MG capsule  Blood pressure elevated  Treat for an acute frontal sinus infection with amoxicillin.   Reassured her that her BP is not dangerously high.  However she will recheck in a week and let me know if she is still concerned   Signed Abbe [place], MD

## 2015-11-29 NOTE — Patient Instructions (Signed)
Use the amoxicillin as directed for your sinus infection Let me know if you are not improving over the next week or so- Sooner if worse.   Your diastolic (bottom number) BP is a bit higher than usual for you; however this is likely due to your current illness.  It is not dangerously elevated.  Perhaps check your blood pressure at home in a week or so and let me know if it continues to run high

## 2015-12-17 ENCOUNTER — Ambulatory Visit (INDEPENDENT_AMBULATORY_CARE_PROVIDER_SITE_OTHER): Payer: BLUE CROSS/BLUE SHIELD | Admitting: Urgent Care

## 2015-12-17 VITALS — BP 122/78 | HR 75 | Temp 98.8°F | Resp 16 | Ht 64.0 in | Wt 173.0 lb

## 2015-12-17 DIAGNOSIS — R05 Cough: Secondary | ICD-10-CM

## 2015-12-17 DIAGNOSIS — R52 Pain, unspecified: Secondary | ICD-10-CM

## 2015-12-17 DIAGNOSIS — R079 Chest pain, unspecified: Secondary | ICD-10-CM | POA: Diagnosis not present

## 2015-12-17 DIAGNOSIS — R059 Cough, unspecified: Secondary | ICD-10-CM

## 2015-12-17 LAB — POCT CBC
Granulocyte percent: 58.6 %G (ref 37–80)
HEMATOCRIT: 44.2 % (ref 37.7–47.9)
HEMOGLOBIN: 14.9 g/dL (ref 12.2–16.2)
LYMPH, POC: 2.1 (ref 0.6–3.4)
MCH: 28.8 pg (ref 27–31.2)
MCHC: 33.8 g/dL (ref 31.8–35.4)
MCV: 85.3 fL (ref 80–97)
MID (cbc): 0.4 (ref 0–0.9)
MPV: 7.2 fL (ref 0–99.8)
POC GRANULOCYTE: 3.6 (ref 2–6.9)
POC LYMPH PERCENT: 34.4 %L (ref 10–50)
POC MID %: 7 %M (ref 0–12)
Platelet Count, POC: 193 10*3/uL (ref 142–424)
RBC: 5.19 M/uL (ref 4.04–5.48)
RDW, POC: 12.7 %
WBC: 6.1 10*3/uL (ref 4.6–10.2)

## 2015-12-17 LAB — POCT INFLUENZA A/B
INFLUENZA A, POC: NEGATIVE
Influenza B, POC: NEGATIVE

## 2015-12-17 MED ORDER — AZITHROMYCIN 250 MG PO TABS: ORAL_TABLET | ORAL | Status: DC

## 2015-12-17 MED ORDER — BENZONATATE 100 MG PO CAPS: 100.0000 mg | ORAL_CAPSULE | Freq: Three times a day (TID) | ORAL | Status: DC | PRN

## 2015-12-17 NOTE — Patient Instructions (Signed)
Azithromycin tablets °What is this medicine? °AZITHROMYCIN (az ith roe MYE sin) is a macrolide antibiotic. It is used to treat or prevent certain kinds of bacterial infections. It will not work for colds, flu, or other viral infections. °This medicine may be used for other purposes; ask your health care provider or pharmacist if you have questions. °What should I tell my health care provider before I take this medicine? °They need to know if you have any of these conditions: °-kidney disease °-liver disease °-irregular heartbeat or heart disease °-an unusual or allergic reaction to azithromycin, erythromycin, other macrolide antibiotics, foods, dyes, or preservatives °-pregnant or trying to get pregnant °-breast-feeding °How should I use this medicine? °Take this medicine by mouth with a full glass of water. Follow the directions on the prescription label. The tablets can be taken with food or on an empty stomach. If the medicine upsets your stomach, take it with food. Take your medicine at regular intervals. Do not take your medicine more often than directed. Take all of your medicine as directed even if you think your are better. Do not skip doses or stop your medicine early. Talk to your pediatrician regarding the use of this medicine in children. Special care may be needed. °Overdosage: If you think you have taken too much of this medicine contact a poison control center or emergency room at once. °NOTE: This medicine is only for you. Do not share this medicine with others. °What if I miss a dose? °If you miss a dose, take it as soon as you can. If it is almost time for your next dose, take only that dose. Do not take double or extra doses. °What may interact with this medicine? °Do not take this medicine with any of the following medications: °-lincomycin °This medicine may also interact with the following medications: °-amiodarone °-antacids °-birth control  pills °-cyclosporine °-digoxin °-magnesium °-nelfinavir °-phenytoin °-warfarin °This list may not describe all possible interactions. Give your health care provider a list of all the medicines, herbs, non-prescription drugs, or dietary supplements you use. Also tell them if you smoke, drink alcohol, or use illegal drugs. Some items may interact with your medicine. °What should I watch for while using this medicine? °Tell your doctor or health care professional if your symptoms do not improve. °Do not treat diarrhea with over the counter products. Contact your doctor if you have diarrhea that lasts more than 2 days or if it is severe and watery. °This medicine can make you more sensitive to the sun. Keep out of the sun. If you cannot avoid being in the sun, wear protective clothing and use sunscreen. Do not use sun lamps or tanning beds/booths. °What side effects may I notice from receiving this medicine? °Side effects that you should report to your doctor or health care professional as soon as possible: °-allergic reactions like skin rash, itching or hives, swelling of the face, lips, or tongue °-confusion, nightmares or hallucinations °-dark urine °-difficulty breathing °-hearing loss °-irregular heartbeat or chest pain °-pain or difficulty passing urine °-redness, blistering, peeling or loosening of the skin, including inside the mouth °-white patches or sores in the mouth °-yellowing of the eyes or skin °Side effects that usually do not require medical attention (report to your doctor or health care professional if they continue or are bothersome): °-diarrhea °-dizziness, drowsiness °-headache °-stomach upset or vomiting °-tooth discoloration °-vaginal irritation °This list may not describe all possible side effects. Call your doctor for medical advice about side   effects. You may report side effects to FDA at 1-800-FDA-1088. °Where should I keep my medicine? °Keep out of the reach of children. °Store at room  temperature between 15 and 30 degrees C (59 and 86 degrees F). Throw away any unused medicine after the expiration date. °NOTE: This sheet is a summary. It may not cover all possible information. If you have questions about this medicine, talk to your doctor, pharmacist, or health care provider. °  °© 2016, [employer]. (2013-05-22 15:38:48) ° °

## 2015-12-17 NOTE — Progress Notes (Signed)
MRN: [id number] DOB: [birth year]  Subjective:   [name] is a 40 y.o. female presenting for chief complaint of Chest Pain; Cough; and chest congestion  Reports 3 day history of mildly productive cough, chest congestion, started having chest pain and shob today, fever (~101.78F). Also has sore throat, tender lymph nodes, nasal congestion, body aches, fatigue. Has tried Tylenol with minimal relief. Of note, patient had significant difficulty overcoming a lower respiratory infection 10-09/2015. She is worried that this may be happening again. Her symptoms are very similar but   Wave has a current medication list which includes the following prescription(s): guaifenesin. Also is allergic to antihistamines, diphenhydramine-type and biaxin.  [name]  has a past medical history of Allergy and Asthma. Also  has past surgical history that includes Cesarean section and Nasal sinus surgery.  Objective:   Vitals: BP 144/84 mmHg  Pulse 75  Temp(Src) 98.8 F (37.1 C) (Oral)  Resp 16  Ht  (1.626 m)  Wt 173 lb (78.472 kg)  BMI 29.68 kg/m2  SpO2 99%  LMP 11/28/2015  BP Readings from Last 3 Encounters:  12/17/15 144/84  11/29/15 124/90  09/05/15 120/80   Physical Exam  Constitutional: She is oriented to person, place, and time. She appears well-developed and well-nourished.  HENT:  TM's intact bilaterally, no effusions or erythema. Nasal turbinates pink and moist, nasal passages patent. No sinus tenderness. Oropharynx clear, mucous membranes moist, dentition in good repair.  Eyes: Right eye exhibits no discharge. Left eye exhibits no discharge. No scleral icterus.  Neck: Normal range of motion. Neck supple.  Cardiovascular: Normal rate, regular rhythm and intact distal pulses.  Exam reveals no gallop and no friction rub.   No murmur heard. Pulmonary/Chest: No respiratory distress. She has no wheezes. She has no rales.  Coarsening of bibasilar lung sounds.    Lymphadenopathy:    She has no cervical adenopathy.  Neurological: She is alert and oriented to person, place, and time.  Skin: Skin is warm and dry.   Results for orders placed or performed in visit on 12/17/15 (from the past 24 hour(s))  POCT CBC     Status: None   Collection Time: 12/17/15 11:31 AM  Result Value Ref Range   WBC 6.1 4.6 - 10.2 K/uL   Lymph, poc 2.1 0.6 - 3.4   POC LYMPH PERCENT 34.4 10 - 50 %L   MID (cbc) 0.4 0 - 0.9   POC MID % 7.0 0 - 12 %M   POC Granulocyte 3.6 2 - 6.9   Granulocyte percent 58.6 37 - 80 %G   RBC 5.19 4.04 - 5.48 M/uL   Hemoglobin 14.9 12.2 - 16.2 g/dL   HCT, POC 78.2 95.6 - 47.9 %   MCV 85.3 80 - 97 fL   MCH, POC 28.8 27 - 31.2 pg   MCHC 33.8 31.8 - 35.4 g/dL   RDW, POC 21.3 %   Platelet Count, POC 193 142 - 424 K/uL   MPV 7.2 0 - 99.8 fL  POCT Influenza A/B     Status: None   Collection Time: 12/17/15 11:31 AM  Result Value Ref Range   Influenza A, POC Negative Negative   Influenza B, POC Negative Negative   Assessment and Plan :   1. Chest pain, unspecified chest pain type 2. Cough 3. Body aches - I discussed use of antibiotics at length with patient. She insists that antibiotics are the only solution. States that antihistamines, cold medicines, Sudafed all  do not work and cause significant side effects including depression, feeling loopy, restless. She states that she tries homeopathic medicines but does not feel relief with these either and is just interested in using Azithromycin. I advised patient that if she does not get better, she would need to rtc for further evaluation and to seriously consider using other medicines besides antibiotics. Patient agreed.  Wallis Bamberg, PA-C [facility] [phone] 12/17/2015 10:47 AM

## 2015-12-22 ENCOUNTER — Ambulatory Visit (INDEPENDENT_AMBULATORY_CARE_PROVIDER_SITE_OTHER): Payer: BLUE CROSS/BLUE SHIELD | Admitting: Family Medicine

## 2015-12-22 VITALS — BP 132/82 | HR 102 | Temp 98.6°F | Resp 17 | Ht 64.5 in | Wt 174.0 lb

## 2015-12-22 DIAGNOSIS — J0100 Acute maxillary sinusitis, unspecified: Secondary | ICD-10-CM | POA: Diagnosis not present

## 2015-12-22 MED ORDER — AMOXICILLIN 875 MG PO TABS: 875.0000 mg | ORAL_TABLET | Freq: Two times a day (BID) | ORAL | Status: DC

## 2015-12-22 NOTE — Patient Instructions (Signed)

## 2015-12-22 NOTE — Progress Notes (Signed)
40 yo woman with two sons who has a week of sinus congestion and cough.   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  Objective:  BP 132/82 mmHg  Pulse 102  Temp(Src) 98.6 F (37 C) (Oral)  Resp 17  Ht 5' 4.5" (1.638 m)  Wt 174 lb (78.926 kg)  BMI 29.42 kg/m2  SpO2 98%  LMP 12/06/2015 HEENT:  Mucopurulent discharge Lungs:  Clear Heart:  Reg, no murmur Skin:  Clear   Assessment:   This chart was scribed in my presence and reviewed by me personally.    ICD-9-CM ICD-10-CM   1. Acute maxillary sinusitis, recurrence not specified 461.0 J01.00 amoxicillin (AMOXIL) 875 MG tablet    Signed, [name], MD

## 2016-02-08 DIAGNOSIS — J020 Streptococcal pharyngitis: Secondary | ICD-10-CM | POA: Diagnosis not present

## 2016-03-09 DIAGNOSIS — J029 Acute pharyngitis, unspecified: Secondary | ICD-10-CM | POA: Diagnosis not present

## 2016-03-09 DIAGNOSIS — Z9289 Personal history of other medical treatment: Secondary | ICD-10-CM | POA: Diagnosis not present

## 2016-03-11 DIAGNOSIS — J020 Streptococcal pharyngitis: Secondary | ICD-10-CM | POA: Diagnosis not present

## 2016-03-11 DIAGNOSIS — J029 Acute pharyngitis, unspecified: Secondary | ICD-10-CM | POA: Diagnosis not present

## 2016-03-15 DIAGNOSIS — J020 Streptococcal pharyngitis: Secondary | ICD-10-CM | POA: Diagnosis not present

## 2016-03-16 DIAGNOSIS — D225 Melanocytic nevi of trunk: Secondary | ICD-10-CM | POA: Diagnosis not present

## 2016-03-16 DIAGNOSIS — D2261 Melanocytic nevi of right upper limb, including shoulder: Secondary | ICD-10-CM | POA: Diagnosis not present

## 2016-03-16 DIAGNOSIS — L814 Other melanin hyperpigmentation: Secondary | ICD-10-CM | POA: Diagnosis not present

## 2016-03-16 DIAGNOSIS — D1800 Hemangioma unspecified site: Secondary | ICD-10-CM | POA: Diagnosis not present

## 2016-04-14 DIAGNOSIS — Z6830 Body mass index (BMI) 30.0-30.9, adult: Secondary | ICD-10-CM | POA: Diagnosis not present

## 2016-04-14 DIAGNOSIS — Z124 Encounter for screening for malignant neoplasm of cervix: Secondary | ICD-10-CM | POA: Diagnosis not present

## 2016-04-14 DIAGNOSIS — E559 Vitamin D deficiency, unspecified: Secondary | ICD-10-CM | POA: Diagnosis not present

## 2016-04-14 DIAGNOSIS — Z01419 Encounter for gynecological examination (general) (routine) without abnormal findings: Secondary | ICD-10-CM | POA: Diagnosis not present

## 2016-05-12 DIAGNOSIS — Z1231 Encounter for screening mammogram for malignant neoplasm of breast: Secondary | ICD-10-CM | POA: Diagnosis not present

## 2016-07-04 DIAGNOSIS — H11441 Conjunctival cysts, right eye: Secondary | ICD-10-CM | POA: Diagnosis not present

## 2016-07-04 DIAGNOSIS — H11431 Conjunctival hyperemia, right eye: Secondary | ICD-10-CM | POA: Diagnosis not present

## 2016-07-07 DIAGNOSIS — H11441 Conjunctival cysts, right eye: Secondary | ICD-10-CM | POA: Diagnosis not present

## 2016-07-07 DIAGNOSIS — H11431 Conjunctival hyperemia, right eye: Secondary | ICD-10-CM | POA: Diagnosis not present

## 2016-07-27 DIAGNOSIS — H10413 Chronic giant papillary conjunctivitis, bilateral: Secondary | ICD-10-CM | POA: Diagnosis not present

## 2016-07-29 DIAGNOSIS — J029 Acute pharyngitis, unspecified: Secondary | ICD-10-CM | POA: Diagnosis not present

## 2016-12-26 ENCOUNTER — Emergency Department (HOSPITAL_COMMUNITY)
Admission: EM | Admit: 2016-12-26 | Discharge: 2016-12-27 | Disposition: A | Discharge status: Home / Self Care | Payer: BLUE CROSS/BLUE SHIELD | Attending: Emergency Medicine | Admitting: Emergency Medicine

## 2016-12-26 ENCOUNTER — Emergency Department (HOSPITAL_COMMUNITY): Payer: BLUE CROSS/BLUE SHIELD

## 2016-12-26 ENCOUNTER — Encounter (HOSPITAL_COMMUNITY): Payer: Self-pay | Admitting: Emergency Medicine

## 2016-12-26 DIAGNOSIS — R109 Unspecified abdominal pain: Secondary | ICD-10-CM

## 2016-12-26 DIAGNOSIS — K829 Disease of gallbladder, unspecified: Secondary | ICD-10-CM | POA: Insufficient documentation

## 2016-12-26 DIAGNOSIS — K828 Other specified diseases of gallbladder: Secondary | ICD-10-CM

## 2016-12-26 DIAGNOSIS — R1011 Right upper quadrant pain: Secondary | ICD-10-CM

## 2016-12-26 DIAGNOSIS — R110 Nausea: Secondary | ICD-10-CM | POA: Diagnosis not present

## 2016-12-26 DIAGNOSIS — J45909 Unspecified asthma, uncomplicated: Secondary | ICD-10-CM | POA: Insufficient documentation

## 2016-12-26 LAB — COMPREHENSIVE METABOLIC PANEL
ALBUMIN: 4.6 g/dL (ref 3.5–5.0)
ALT: 16 U/L (ref 14–54)
ANION GAP: 7 (ref 5–15)
AST: 18 U/L (ref 15–41)
Alkaline Phosphatase: 52 U/L (ref 38–126)
BUN: 13 mg/dL (ref 6–20)
CO2: 27 mmol/L (ref 22–32)
Calcium: 9.5 mg/dL (ref 8.9–10.3)
Chloride: 103 mmol/L (ref 101–111)
Creatinine, Ser: 0.82 mg/dL (ref 0.44–1.00)
GFR calc Af Amer: >60 mL/min (ref >60)
GFR calc non Af Amer: >60 mL/min (ref >60)
GLUCOSE: 105 mg/dL — AB (ref 65–99)
Potassium: 3.7 mmol/L (ref 3.5–5.1)
SODIUM: 137 mmol/L (ref 135–145)
TOTAL PROTEIN: 7.5 g/dL (ref 6.5–8.1)
Total Bilirubin: 0.6 mg/dL (ref 0.3–1.2)

## 2016-12-26 LAB — DIFFERENTIAL
BASOS ABS: 0 10*3/uL (ref 0.0–0.1)
BASOS PCT: 0 %
EOS ABS: 0.3 10*3/uL (ref 0.0–0.7)
Eosinophils Relative: 1 %
Lymphocytes Relative: 13 %
Lymphs Abs: 2.3 10*3/uL (ref 0.7–4.0)
MONOS PCT: 3 %
Monocytes Absolute: 0.6 10*3/uL (ref 0.1–1.0)
Neutro Abs: 15.2 10*3/uL — ABNORMAL HIGH (ref 1.7–7.7)
Neutrophils Relative %: 83 %

## 2016-12-26 LAB — URINALYSIS, ROUTINE W REFLEX MICROSCOPIC
BACTERIA UA: NONE SEEN
BILIRUBIN URINE: NEGATIVE
Glucose, UA: NEGATIVE mg/dL
HGB URINE DIPSTICK: NEGATIVE
Ketones, ur: NEGATIVE mg/dL
Leukocytes, UA: NEGATIVE
NITRITE: POSITIVE — AB
PROTEIN: NEGATIVE mg/dL
Specific Gravity, Urine: 1.016 (ref 1.005–1.030)
pH: 5 (ref 5.0–8.0)

## 2016-12-26 LAB — CBC
HEMATOCRIT: 40.8 % (ref 36.0–46.0)
HEMOGLOBIN: 14.3 g/dL (ref 12.0–15.0)
MCH: 29.9 pg (ref 26.0–34.0)
MCHC: 35 g/dL (ref 30.0–36.0)
MCV: 85.2 fL (ref 78.0–100.0)
Platelets: 214 10*3/uL (ref 150–400)
RBC: 4.79 MIL/uL (ref 3.87–5.11)
RDW: 12.3 % (ref 11.5–15.5)
WBC: 18.7 10*3/uL — ABNORMAL HIGH (ref 4.0–10.5)

## 2016-12-26 LAB — I-STAT BETA HCG BLOOD, ED (MC, WL, AP ONLY)

## 2016-12-26 LAB — LIPASE, BLOOD: LIPASE: 29 U/L (ref 11–51)

## 2016-12-26 MED ORDER — FENTANYL CITRATE (PF) 100 MCG/2ML IJ SOLN
50.0000 ug | Freq: Once | INTRAMUSCULAR | Status: DC
  Filled 2016-12-26: qty 2

## 2016-12-26 MED ORDER — KETOROLAC TROMETHAMINE 30 MG/ML IJ SOLN
30.0000 mg | Freq: Once | INTRAMUSCULAR | Status: AC
Start: 2016-12-26 — End: 2016-12-26
  Administered 2016-12-26: 30 mg via INTRAVENOUS
  Filled 2016-12-26: qty 1

## 2016-12-26 MED ORDER — IOPAMIDOL (ISOVUE-300) INJECTION 61%
INTRAVENOUS | Status: AC
  Filled 2016-12-26: qty 100

## 2016-12-26 MED ORDER — ONDANSETRON HCL 4 MG/2ML IJ SOLN
4.0000 mg | Freq: Once | INTRAMUSCULAR | Status: AC
Start: 2016-12-26 — End: 2016-12-26
  Administered 2016-12-26: 4 mg via INTRAVENOUS
  Filled 2016-12-26: qty 2

## 2016-12-26 MED ORDER — IOPAMIDOL (ISOVUE-300) INJECTION 61%
100.0000 mL | Freq: Once | INTRAVENOUS | Status: AC | PRN
  Administered 2016-12-26: 100 mL via INTRAVENOUS

## 2016-12-26 NOTE — ED Notes (Signed)
Lab states will add on differential. Pt transported to CT.

## 2016-12-26 NOTE — ED Triage Notes (Signed)
Patient c/o abd pain, back pain and nausea that started around 2pm this afternoon. Patient denies any v/d and had BM today that was normal. patinet took 2 OTC UTI medications because states she gets them frequently.

## 2016-12-26 NOTE — ED Provider Notes (Signed)
[facility]-EMERGENCY DEPT Provider Note   CSN: [id number] Arrival date & time: 12/26/16  1618     History   Chief Complaint Chief Complaint  Patient presents with  . Abdominal Pain  . Nausea  . Back Pain    HPI [name] is a 41 y.o. female.  She had sudden onset this afternoon of pain in her abdomen. It started in the right midabdomen and radiated across and into the back. Pain is worse with movement and better with sitting still. There is associated nausea but no vomiting. She thought it might of been a urinary tract infections it took over-the-counter Azo without any benefit. She denies fever, chills, sweats she denies urinary urgency, frequency, tenesmus, dysuria. She states a similar episode in the past was originally felt to be appendicitis and proved to be a urinary tract infection. Currently, she states that her pain is just a dull discomfort if she lays still.   The history is provided by the patient.  Abdominal Pain    Back Pain   Associated symptoms include abdominal pain.    Past Medical History:  Diagnosis Date  . Allergy   . Asthma     There are no active problems to display for this patient.   Past Surgical History:  Procedure Laterality Date  . CESAREAN SECTION    . NASAL SINUS SURGERY      OB History    No data available       Home Medications    Prior to Admission medications   Medication Sig Start Date End Date Taking? Authorizing Provider  amoxicillin (AMOXIL) 875 MG tablet Take 1 tablet (875 mg total) by mouth 2 (two) times daily. 12/22/15   [name], MD  dextromethorphan-guaiFENesin [facility] DM) 30-600 MG 12hr tablet Take 1 tablet by mouth 2 (two) times daily.    Historical Provider, MD  guaifenesin (ROBITUSSIN) 100 MG/5ML syrup Take 200 mg by mouth 3 (three) times daily as needed for cough.    Historical Provider, MD    Family History Family History  Problem Relation Age of Onset  . Hypertension Mother   . Hypertension  Father   . Heart attack Father   . Congestive Heart Failure Maternal Grandmother   . Cancer Maternal Grandfather   . Dementia Maternal Grandfather   . Cancer Paternal Grandmother   . Cancer Paternal Grandfather   . Asthma Son     Social History Social History  Substance Use Topics  . Smoking status: Never Smoker  . Smokeless tobacco: Never Used  . Alcohol use Yes     Comment: 7-8/week     Allergies   Antihistamines, diphenhydramine-type and Biaxin [clarithromycin]   Review of Systems Review of Systems  Gastrointestinal: Positive for abdominal pain.  Musculoskeletal: Positive for back pain.  All other systems reviewed and are negative.    Physical Exam Updated Vital Signs BP 152/94 (BP Location: Left Arm)   Pulse 93   Temp 98.9 F (37.2 C) (Oral)   Resp 16   Ht  (1.626 m)   Wt 178 lb 7 oz (80.9 kg)   LMP 12/14/2016   SpO2 99%   BMI 30.63 kg/m   Physical Exam  Nursing note and vitals reviewed.  41 year old female, resting comfortably and in no acute distress. Vital signs are Significant for hypertension. Oxygen saturation is 99%, which is normal. Head is normocephalic and atraumatic. PERRLA, EOMI. Oropharynx is clear. Neck is nontender and supple without adenopathy or JVD.  Back is nontender and there is no CVA tenderness. Lungs are clear without rales, wheezes, or rhonchi. Chest is nontender. Heart has regular rate and rhythm without murmur. Abdomen is soft, flat, with tenderness diffusely. Maximum tenderness is in the right upper quadrant with a positive Murphy sign. There is rebound tenderness in the right upper quadrant. There is no voluntary guarding. There are no masses or hepatosplenomegaly and peristalsis is hypoactive. Extremities have no cyanosis or edema, full range of motion is present. Skin is warm and dry without rash. Neurologic: Mental status is normal, cranial nerves are intact, there are no motor or sensory deficits.  ED Treatments /  Results  Labs (all labs ordered are listed, but only abnormal results are displayed) Labs Reviewed  COMPREHENSIVE METABOLIC PANEL - Abnormal; Notable for the following:       Result Value   Glucose, Bld 105 (*)    All other components within normal limits  CBC - Abnormal; Notable for the following:    WBC 18.7 (*)    All other components within normal limits  URINALYSIS, ROUTINE W REFLEX MICROSCOPIC - Abnormal; Notable for the following:    Color, Urine AMBER (*)    Nitrite POSITIVE (*)    Squamous Epithelial / LPF 0-5 (*)    All other components within normal limits  DIFFERENTIAL - Abnormal; Notable for the following:    Neutro Abs 15.2 (*)    All other components within normal limits  LIPASE, BLOOD  I-STAT BETA HCG BLOOD, ED ([facility], [facility], [facility] ONLY)    Radiology Ct Abdomen Pelvis W Contrast  Result Date: 12/26/2016 CLINICAL DATA:  Abdominal and back pain. Nausea. Symptoms began at 2 p.m. today. EXAM: CT ABDOMEN AND PELVIS WITH CONTRAST TECHNIQUE: Multidetector CT imaging of the abdomen and pelvis was performed using the standard protocol following bolus administration of intravenous contrast. CONTRAST:  100 ml ISOVUE-300 IOPAMIDOL (ISOVUE-300) INJECTION 61% COMPARISON:  CT abdomen and pelvis 04/09/2012. FINDINGS: Lower chest: Clear.  No pleural or pericardial effusion. Hepatobiliary: No focal liver abnormality is seen. No gallstones, gallbladder wall thickening, or biliary dilatation. Pancreas: Unremarkable. No pancreatic ductal dilatation or surrounding inflammatory changes. Spleen: A few small calcifications in the spleen are consistent with old granulomas disease. The spleen otherwise appears normal. Adrenals/Urinary Tract: Adrenal glands are unremarkable. Kidneys are normal, without renal calculi, focal lesion, or hydronephrosis. Bladder is unremarkable. Stomach/Bowel: Stomach is within normal limits. Appendix appears normal. No evidence of bowel wall thickening, distention, or inflammatory  changes. Vascular/Lymphatic: No significant vascular findings are present. No enlarged abdominal or pelvic lymph nodes. Reproductive: Uterus and bilateral adnexa are unremarkable. Other: No abdominal wall hernia or abnormality. No abdominopelvic ascites. Musculoskeletal: Negative. IMPRESSION: Negative CT abdomen and pelvis. No finding to explain the patient's symptoms. Electronically Signed   By: Drusilla Kanner M.D.   On: 12/26/2016 20:11   US Abdomen Limited Ruq  Result Date: 12/26/2016 CLINICAL DATA:  Right upper quadrant pain EXAM: US ABDOMEN LIMITED - RIGHT UPPER QUADRANT COMPARISON:  CT abdomen pelvis 12/26/2016 FINDINGS: Gallbladder: No gallstones or wall thickening visualized. No sonographic Murphy sign noted by sonographer. Small amount of gallbladder sludge. Common bile duct: Diameter: 2.7 mm Liver: No focal lesion identified. Within normal limits in parenchymal echogenicity. IMPRESSION: No evidence of acute cholecystitis. Electronically Signed   By: Deatra Robinson M.D.   On: 12/26/2016 22:47    Procedures Procedures (including critical care time)  Medications Ordered in ED Medications  iopamidol (ISOVUE-300) 61 % injection (not administered)  fentaNYL (SUBLIMAZE) injection 50 mcg (50 mcg Intravenous Not Given 12/26/16 2210)  ondansetron (ZOFRAN) injection 4 mg (4 mg Intravenous Given 12/26/16 2020)  iopamidol (ISOVUE-300) 61 % injection 100 mL (100 mLs Intravenous Contrast Given 12/26/16 1958)  ketorolac (TORADOL) 30 MG/ML injection 30 mg (30 mg Intravenous Given 12/26/16 2331)     Initial Impression / Assessment and Plan / ED Course  I have reviewed the triage vital signs and the nursing notes.  Pertinent labs & imaging results that were available during my care of the patient were reviewed by me and considered in my medical decision making (see chart for details).  Abdominal pain with maximum tenderness in the right upper quadrant suggestive of biliary tract disease. However, it is  very unusual to have tenderness across the entire abdomen with acute cholecystitis. She'll be sent for CT of abdomen and pelvis to rule out serious pathology. She may need follow-up right upper quadrant ultrasound. She is given ondansetron for nausea. Unfortunately, she states that she has severe itching with any narcotic analgesics and is allergic to diphenhydramine. She is requesting no narcotic pain medication at this time. Old records are reviewed, and she has no relevant past visits. However, CT of abdomen and pelvis in 2010 did show probable gallstone.  Abdominal CT is unremarkable. She is sent for abdominal ultrasound which does show some sludge in the gallbladder but no gallstones and no evidence of acute cholecystitis. Once it was clear there was no surgical process, she is given a dose of ketorolac. I discussed the possibility of oral narcotics, but patient is concerned because she gets breakouts pain with them. Decision is made to give her prescription for tramadol and ondansetron. She is referred to [facility] surgery for evaluation for possible cholecystectomy. Return precautions discussed.  Final Clinical Impressions(s) / ED Diagnoses   Final diagnoses:  RUQ abdominal pain  Gallbladder sludge    New Prescriptions New Prescriptions   ONDANSETRON (ZOFRAN) 4 MG TABLET    Take 1 tablet (4 mg total) by mouth every 6 (six) hours as needed for nausea.   TRAMADOL (ULTRAM) 50 MG TABLET    Take 1 tablet (50 mg total) by mouth every 6 (six) hours as needed.     Dione Booze, MD 12/27/16 [phone]

## 2016-12-27 MED ORDER — ONDANSETRON HCL 4 MG PO TABS: 4.0000 mg | ORAL_TABLET | Freq: Four times a day (QID) | ORAL | 0 refills | Status: DC | PRN

## 2016-12-27 MED ORDER — TRAMADOL HCL 50 MG PO TABS: 50.0000 mg | ORAL_TABLET | Freq: Four times a day (QID) | ORAL | 0 refills | Status: DC | PRN

## 2016-12-27 NOTE — Discharge Instructions (Signed)
Return if pain is not being adequately controlled at home. ?

## 2017-01-02 ENCOUNTER — Other Ambulatory Visit: Payer: Self-pay | Admitting: Gastroenterology

## 2017-01-02 DIAGNOSIS — R1011 Right upper quadrant pain: Secondary | ICD-10-CM

## 2017-01-02 DIAGNOSIS — K219 Gastro-esophageal reflux disease without esophagitis: Secondary | ICD-10-CM | POA: Diagnosis not present

## 2017-01-02 DIAGNOSIS — R110 Nausea: Secondary | ICD-10-CM

## 2017-01-02 DIAGNOSIS — K828 Other specified diseases of gallbladder: Secondary | ICD-10-CM | POA: Diagnosis not present

## 2017-01-03 ENCOUNTER — Ambulatory Visit (HOSPITAL_COMMUNITY)
Admission: RE | Admit: 2017-01-03 | Discharge: 2017-01-03 | Disposition: A | Discharge status: Home / Self Care | Payer: BLUE CROSS/BLUE SHIELD | Source: Ambulatory Visit | Attending: Gastroenterology | Admitting: Gastroenterology

## 2017-01-03 DIAGNOSIS — R1011 Right upper quadrant pain: Secondary | ICD-10-CM | POA: Insufficient documentation

## 2017-01-03 DIAGNOSIS — R110 Nausea: Secondary | ICD-10-CM | POA: Insufficient documentation

## 2017-01-03 DIAGNOSIS — R1031 Right lower quadrant pain: Secondary | ICD-10-CM | POA: Diagnosis not present

## 2017-01-03 MED ORDER — TECHNETIUM TC 99M MEBROFENIN IV KIT
5.5000 | PACK | Freq: Once | INTRAVENOUS | Status: AC | PRN
  Administered 2017-01-03: 5.5 via INTRAVENOUS

## 2017-01-18 DIAGNOSIS — K219 Gastro-esophageal reflux disease without esophagitis: Secondary | ICD-10-CM | POA: Diagnosis not present

## 2017-01-18 DIAGNOSIS — R140 Abdominal distension (gaseous): Secondary | ICD-10-CM | POA: Diagnosis not present

## 2017-03-12 DIAGNOSIS — D1800 Hemangioma unspecified site: Secondary | ICD-10-CM | POA: Diagnosis not present

## 2017-03-12 DIAGNOSIS — L814 Other melanin hyperpigmentation: Secondary | ICD-10-CM | POA: Diagnosis not present

## 2017-03-12 DIAGNOSIS — D2261 Melanocytic nevi of right upper limb, including shoulder: Secondary | ICD-10-CM | POA: Diagnosis not present

## 2017-03-12 DIAGNOSIS — D225 Melanocytic nevi of trunk: Secondary | ICD-10-CM | POA: Diagnosis not present

## 2017-10-31 DIAGNOSIS — Z23 Encounter for immunization: Secondary | ICD-10-CM | POA: Diagnosis not present

## 2017-12-26 DIAGNOSIS — R55 Syncope and collapse: Secondary | ICD-10-CM | POA: Diagnosis not present

## 2017-12-26 DIAGNOSIS — R42 Dizziness and giddiness: Secondary | ICD-10-CM | POA: Diagnosis not present

## 2018-01-01 DIAGNOSIS — N182 Chronic kidney disease, stage 2 (mild): Secondary | ICD-10-CM | POA: Diagnosis not present

## 2018-03-20 DIAGNOSIS — L814 Other melanin hyperpigmentation: Secondary | ICD-10-CM | POA: Diagnosis not present

## 2018-03-20 DIAGNOSIS — D225 Melanocytic nevi of trunk: Secondary | ICD-10-CM | POA: Diagnosis not present

## 2018-03-20 DIAGNOSIS — D2261 Melanocytic nevi of right upper limb, including shoulder: Secondary | ICD-10-CM | POA: Diagnosis not present

## 2018-03-20 DIAGNOSIS — D1800 Hemangioma unspecified site: Secondary | ICD-10-CM | POA: Diagnosis not present

## 2018-03-29 DIAGNOSIS — Z0000 Encounter for general adult medical examination without abnormal findings: Secondary | ICD-10-CM | POA: Diagnosis not present

## 2018-03-29 DIAGNOSIS — Z8639 Personal history of other endocrine, nutritional and metabolic disease: Secondary | ICD-10-CM | POA: Diagnosis not present

## 2018-03-29 DIAGNOSIS — Z13220 Encounter for screening for lipoid disorders: Secondary | ICD-10-CM | POA: Diagnosis not present

## 2018-03-29 DIAGNOSIS — Z23 Encounter for immunization: Secondary | ICD-10-CM | POA: Diagnosis not present

## 2018-07-26 DIAGNOSIS — Z23 Encounter for immunization: Secondary | ICD-10-CM | POA: Diagnosis not present

## 2018-09-30 DIAGNOSIS — J111 Influenza due to unidentified influenza virus with other respiratory manifestations: Secondary | ICD-10-CM | POA: Diagnosis not present

## 2019-03-25 DIAGNOSIS — L814 Other melanin hyperpigmentation: Secondary | ICD-10-CM | POA: Diagnosis not present

## 2019-03-25 DIAGNOSIS — D485 Neoplasm of uncertain behavior of skin: Secondary | ICD-10-CM | POA: Diagnosis not present

## 2019-03-25 DIAGNOSIS — D225 Melanocytic nevi of trunk: Secondary | ICD-10-CM | POA: Diagnosis not present

## 2019-03-25 DIAGNOSIS — D2261 Melanocytic nevi of right upper limb, including shoulder: Secondary | ICD-10-CM | POA: Diagnosis not present

## 2019-07-21 ENCOUNTER — Other Ambulatory Visit: Payer: Self-pay

## 2019-07-21 DIAGNOSIS — R6889 Other general symptoms and signs: Secondary | ICD-10-CM | POA: Diagnosis not present

## 2019-07-21 DIAGNOSIS — Z20822 Contact with and (suspected) exposure to covid-19: Secondary | ICD-10-CM

## 2019-07-22 LAB — NOVEL CORONAVIRUS, NAA: SARS-CoV-2, NAA: NOT DETECTED

## 2019-07-31 DIAGNOSIS — Z23 Encounter for immunization: Secondary | ICD-10-CM | POA: Diagnosis not present

## 2019-08-26 ENCOUNTER — Other Ambulatory Visit: Payer: Self-pay

## 2019-08-26 DIAGNOSIS — Z20822 Contact with and (suspected) exposure to covid-19: Secondary | ICD-10-CM

## 2019-08-28 LAB — NOVEL CORONAVIRUS, NAA: SARS-CoV-2, NAA: NOT DETECTED

## 2020-03-24 ENCOUNTER — Ambulatory Visit (INDEPENDENT_AMBULATORY_CARE_PROVIDER_SITE_OTHER): Payer: BC Managed Care – PPO | Admitting: Obstetrics and Gynecology

## 2020-03-24 ENCOUNTER — Other Ambulatory Visit: Payer: Self-pay

## 2020-03-24 ENCOUNTER — Other Ambulatory Visit (HOSPITAL_COMMUNITY)
Admission: RE | Admit: 2020-03-24 | Discharge: 2020-03-24 | Disposition: A | Discharge status: Home / Self Care | Payer: BC Managed Care – PPO | Source: Ambulatory Visit | Attending: Obstetrics and Gynecology | Admitting: Obstetrics and Gynecology

## 2020-03-24 ENCOUNTER — Encounter: Payer: Self-pay | Admitting: Obstetrics and Gynecology

## 2020-03-24 VITALS — BP 115/70 | HR 82 | Resp 16 | Ht 64.5 in | Wt 139.0 lb

## 2020-03-24 DIAGNOSIS — Z23 Encounter for immunization: Secondary | ICD-10-CM

## 2020-03-24 DIAGNOSIS — N939 Abnormal uterine and vaginal bleeding, unspecified: Secondary | ICD-10-CM

## 2020-03-24 DIAGNOSIS — Z01419 Encounter for gynecological examination (general) (routine) without abnormal findings: Secondary | ICD-10-CM | POA: Insufficient documentation

## 2020-03-24 MED ORDER — MEGESTROL ACETATE 40 MG PO TABS: 40.0000 mg | ORAL_TABLET | Freq: Two times a day (BID) | ORAL | 1 refills | Status: DC

## 2020-03-24 NOTE — Progress Notes (Signed)
GYNECOLOGY ANNUAL PREVENTATIVE CARE ENCOUNTER NOTE  History:     [name] is a 44 y.o. G59P2000 female here for a routine annual gynecologic exam.  Current complaints: heavy menstrual cycles which is new. Family hx of fibroids. Her mother had hysterectomy in 68's.    Denies abnormal vaginal bleeding, discharge, pelvic pain, problems with intercourse or other gynecologic concerns.    Patient has lost 50 lbs; purposeful weight loss.    Gynecologic History Patient's last menstrual period was 03/05/2020. Contraception: vasectomy Last Pap: 2019. Results were: normal with negative HPV- Patient requesting pap today.  Last mammogram: 2019. Results were: normal  Obstetric History OB History  Gravida Para Term Preterm AB Living  2 2 2         SAB TAB Ectopic Multiple Live Births          2    # Outcome Date GA Lbr Len/2nd Weight Sex Delivery Anes PTL Lv  2 Term      CS-LTranv     1 Term      CS-LTranv       Past Medical History:  Diagnosis Date  . Allergy   . Asthma     Past Surgical History:  Procedure Laterality Date  . CESAREAN SECTION    . NASAL SINUS SURGERY      Current Outpatient Medications on File Prior to Visit  Medication Sig Dispense Refill  . Multiple Vitamins-Minerals (MULTIVITAMIN ADULTS) TABS Take 1 capsule by mouth daily.    . Probiotic Product (PROBIOTIC-10 PO) Take 1 tablet by mouth daily.    . cholecalciferol (VITAMIN D) 1000 units tablet Take 1,000 Units by mouth daily.     No current facility-administered medications on file prior to visit.    Allergies  Allergen Reactions  . Amoxicillin-Pot Clavulanate Nausea And Vomiting  . Antihistamines, Diphenhydramine-Type Other (See Comments)    Oral antihistamines makes PT depressed  . Biaxin [Clarithromycin] Other (See Comments)    Migraines, she takes zpak with no problems    Social History:  reports that she has never smoked. She has never used smokeless tobacco. She reports current alcohol  use. She reports that she does not use drugs.  Family History  Problem Relation Age of Onset  . Hypertension Mother   . Hypertension Father   . Heart attack Father   . Congestive Heart Failure Maternal Grandmother   . Cancer Maternal Grandfather   . Dementia Maternal Grandfather   . Cancer Paternal Grandmother   . Cancer Paternal Grandfather   . Asthma Son     The following portions of the patient's history were reviewed and updated as appropriate: allergies, current medications, past family history, past medical history, past social history, past surgical history and problem list.  Review of Systems Pertinent items noted in HPI and remainder of comprehensive ROS otherwise negative.  Physical Exam:  BP 115/70   Pulse 82   Resp 16   Ht 5' 4.5" (1.638 m)   Wt 139 lb (63 kg)   LMP 03/05/2020   BMI 23.49 kg/m  CONSTITUTIONAL: Well-developed, well-nourished female in no acute distress.  HENT:  Normocephalic, atraumatic, External right and left ear normal. Oropharynx is clear and moist EYES: Conjunctivae and EOM are normal. Pupils are equal, round, and reactive to light. No scleral icterus.  NECK: Normal range of motion, supple, no masses.  Normal thyroid.  SKIN: Skin is warm and dry. No rash noted. Not diaphoretic. No erythema. No pallor. MUSCULOSKELETAL: Normal range  of motion. No tenderness.  No cyanosis, clubbing, or edema.  2+ distal pulses. NEUROLOGIC: Alert and oriented to person, place, and time. Normal reflexes, muscle tone coordination.  PSYCHIATRIC: Normal mood and affect. Normal behavior. Normal judgment and thought content. CARDIOVASCULAR: Normal heart rate noted, regular rhythm RESPIRATORY: Clear to auscultation bilaterally. Effort and breath sounds normal, no problems with respiration noted. BREASTS: Symmetric in size. No masses, tenderness, skin changes, nipple drainage, or lymphadenopathy bilaterally. Performed in the presence of a chaperone. ABDOMEN: Soft, no  distention noted.  No tenderness, rebound or guarding.  PELVIC: Normal appearing external genitalia and urethral meatus; normal appearing vaginal mucosa and cervix.  No abnormal discharge noted.  Pap smear obtained.  Normal uterine size, no other palpable masses, no uterine or adnexal tenderness.  Performed in the presence of a chaperone.   Assessment and Plan:   1. Encounter for annual routine gynecological examination  - TSH - HIV antibody (with reflex) - Cytology - PAP( [facility]) - MM Digital Screening; Future  2. Abnormal uterine bleeding  - Family history of uterine fibroids.  - Periods have been heavier than normal - Concerned about bleeding. Rx: Megace to use during period. Will do pelvic US and bring patient back for consultation with MD for ablation. Patient declines IUD placement to control heavy uterine bleeding.  - US Pelvis Complete; Future  Will follow up results of pap smear and manage accordingly. Mammogram scheduled Routine preventative health maintenance measures emphasized. Please refer to After Visit Summary for other counseling recommendations.    Elzy Tomasello, Artist Pais, [place] for [employer], [place]

## 2020-03-25 LAB — HIV ANTIBODY (ROUTINE TESTING W REFLEX): HIV 1&2 Ab, 4th Generation: NONREACTIVE

## 2020-03-25 LAB — TSH: TSH: 1.5 mIU/L

## 2020-03-26 ENCOUNTER — Ambulatory Visit
Admission: RE | Admit: 2020-03-26 | Discharge: 2020-03-26 | Disposition: A | Discharge status: Home / Self Care | Payer: BC Managed Care – PPO | Source: Ambulatory Visit | Attending: Obstetrics and Gynecology | Admitting: Obstetrics and Gynecology

## 2020-03-26 ENCOUNTER — Other Ambulatory Visit: Payer: Self-pay

## 2020-03-26 DIAGNOSIS — Z01419 Encounter for gynecological examination (general) (routine) without abnormal findings: Secondary | ICD-10-CM

## 2020-03-26 DIAGNOSIS — Z1231 Encounter for screening mammogram for malignant neoplasm of breast: Secondary | ICD-10-CM | POA: Diagnosis not present

## 2020-03-31 LAB — CYTOLOGY - PAP
Comment: NEGATIVE
Diagnosis: NEGATIVE
High risk HPV: NEGATIVE

## 2020-04-01 ENCOUNTER — Other Ambulatory Visit: Payer: Self-pay | Admitting: Obstetrics and Gynecology

## 2020-04-01 MED ORDER — FLUCONAZOLE 150 MG PO TABS: 150.0000 mg | ORAL_TABLET | Freq: Every day | ORAL | 0 refills | Status: DC

## 2020-04-13 ENCOUNTER — Encounter: Payer: Self-pay | Admitting: Osteopathic Medicine

## 2020-04-13 ENCOUNTER — Ambulatory Visit (INDEPENDENT_AMBULATORY_CARE_PROVIDER_SITE_OTHER): Payer: BC Managed Care – PPO

## 2020-04-13 ENCOUNTER — Other Ambulatory Visit: Payer: Self-pay

## 2020-04-13 ENCOUNTER — Ambulatory Visit (INDEPENDENT_AMBULATORY_CARE_PROVIDER_SITE_OTHER): Payer: BC Managed Care – PPO | Admitting: Osteopathic Medicine

## 2020-04-13 VITALS — BP 116/79 | HR 75 | Temp 98.2°F | Ht 64.0 in | Wt 145.0 lb

## 2020-04-13 DIAGNOSIS — N181 Chronic kidney disease, stage 1: Secondary | ICD-10-CM

## 2020-04-13 DIAGNOSIS — Z0000 Encounter for general adult medical examination without abnormal findings: Secondary | ICD-10-CM

## 2020-04-13 DIAGNOSIS — Z8759 Personal history of other complications of pregnancy, childbirth and the puerperium: Secondary | ICD-10-CM | POA: Diagnosis not present

## 2020-04-13 DIAGNOSIS — N939 Abnormal uterine and vaginal bleeding, unspecified: Secondary | ICD-10-CM | POA: Diagnosis not present

## 2020-04-13 DIAGNOSIS — Z8709 Personal history of other diseases of the respiratory system: Secondary | ICD-10-CM

## 2020-04-13 NOTE — Patient Instructions (Signed)
General Preventive Care  Most recent routine screening labs: ordered.   Blood pressure goal 130/80 or less.   Tobacco: don't!   Alcohol: responsible moderation is ok for most adults - if you have concerns about your alcohol intake, please talk to me!   Exercise: as tolerated to reduce risk of cardiovascular disease and diabetes. Strength training will also prevent osteoporosis.   Mental health: if need for mental health care (medicines, counseling, other), or concerns about moods, please let me know!   Sexual / Reproductive health: if need for STD testing, or if concerns with libido/pain problems, please let me or OBGYN know! If you need to discuss family planning, please let me or OBGYN know!   Advanced Directive: Living Will and/or Healthcare Power of Attorney recommended for all adults, regardless of age or health.  Vaccines  Flu vaccine: for almost everyone, every fall.   Shingles vaccine: after age 51.   Pneumonia vaccines: after age 76  Tetanus booster: every 10 years    COVID vaccine: THANKS for getting your vaccine! :)  Cancer screenings   Colon cancer screening: for everyone age 34-75. Colonoscopy available for all, many people also qualify for the Cologuard stool test   Breast cancer screening: mammogram at age 61 every other year at least, and annually after age 71.   Cervical cancer screening: Pap per OBGYN   Lung cancer screening: CT chest every year for those aged 8 to 80 years who have a 20 pack-year smoking history and currently smoke or have quit within the past 15 years  Infection screenings  . HIV: recommended at least once age 73-65 . Gonorrhea/Chlamydia: screening as needed, . Hepatitis C: recommended once for everyone age 37-75 . TB: certain at-risk populations, or depending on work requirements and/or travel history Other . Bone Density Test: recommended for women at age 35

## 2020-04-13 NOTE — Progress Notes (Signed)
[name] is a 44 y.o. female who presents to  [facility]  today, 04/13/20, seeking care for the following: . Annual physical  Very pleasant new patient here to establish Works as Control and instrumentation engineer Married, husband is Clinical research associate 2 kids Likes to travel    BP Readings from Last 3 Encounters:  04/13/20 116/79  03/24/20 115/70  12/26/16 121/82       ASSESSMENT & PLAN with other pertinent history/findings:  The primary encounter diagnosis was Annual physical exam. Diagnoses of History of asthma, History of gestational hypertension, and CKD (chronic kidney disease) stage 1, GFR 90 ml/min or greater were also pertinent to this visit.   General Preventive Care  Most recent routine screening labs: ordered.   Blood pressure goal 130/80 or less.   Tobacco: don't!   Alcohol: responsible moderation is ok for most adults - if you have concerns about your alcohol intake, please talk to me!   Exercise: as tolerated to reduce risk of cardiovascular disease and diabetes. Strength training will also prevent osteoporosis.   Mental health: if need for mental health care (medicines, counseling, other), or concerns about moods, please let me know!   Sexual / Reproductive health: if need for STD testing, or if concerns with libido/pain problems, please let me or OBGYN know! If you need to discuss family planning, please let me or OBGYN know!   Advanced Directive: Living Will and/or Healthcare Power of Attorney recommended for all adults, regardless of age or health.  Vaccines  Flu vaccine: for almost everyone, every fall.   Shingles vaccine: after age 78.   Pneumonia vaccines: after age 39  Tetanus booster: every 10 years    COVID vaccine: THANKS for getting your vaccine! :)  Cancer screenings   Colon cancer screening: for everyone age 80-75. Colonoscopy available for all, many people also qualify for the Cologuard stool test    Breast cancer screening: mammogram at age 21 every other year at least, and annually after age 33.   Cervical cancer screening: Pap per OBGYN   Lung cancer screening: CT chest every year for those aged 70 to 80 years who have a 20 pack-year smoking history and currently smoke or have quit within the past 15 years  Infection screenings  . HIV: recommended at least once age 35-65 . Gonorrhea/Chlamydia: screening as needed, . Hepatitis C: recommended once for everyone age 73-75 . TB: certain at-risk populations, or depending on work requirements and/or travel history Other . Bone Density Test: recommended for women at age 48      Constitutional:  . VSS, see nurse notes . General Appearance: alert, well-developed, well-nourished, NAD Eyes: Marland Kitchen Normal lids and conjunctive, non-icteric sclera Neck: . No masses, trachea midline . No thyroid enlargement/tenderness/mass appreciated Respiratory: . Normal respiratory effort . Breath sounds normal, no wheeze/rhonchi/rales Cardiovascular: . S1/S2 normal, no murmur/rub/gallop auscultated . No lower extremity edema Gastrointestinal: . Nontender, no masses . No hepatomegaly, no splenomegaly . No hernia appreciated Musculoskeletal:  . Gait normal . No clubbing/cyanosis of digits Neurological: . No cranial nerve deficit on limited exam . Motor and sensation intact and symmetric Psychiatric: . Normal judgment/insight . Normal mood and affect       Orders Placed This Encounter  Procedures  . CBC  . COMPLETE METABOLIC PANEL WITH GFR  . Lipid panel    No orders of the defined types were placed in this encounter.      Follow-up instructions: Return  in about 1 year (around 04/13/2021) for [place] (call week prior to visit for lab orders).                                         BP 116/79   Pulse 75   Temp 98.2 F (36.8 C) (Oral)   Ht 5\' 4"  (1.626 m)   Wt 145 lb (65.8 kg)   LMP  04/01/2020 (Exact Date)   SpO2 99% Comment: on RA  BMI 24.89 kg/m   No outpatient medications have been marked as taking for the 04/13/20 encounter (Office Visit) with Emeterio Reeve, DO.    No results found for this or any previous visit (from the past 72 hour(s)).  No results found.  Depression screen [facility] 2/9 04/13/2020 12/22/2015 12/17/2015  Decreased Interest 0 0 0  Down, Depressed, Hopeless 0 0 0  PHQ - 2 Score 0 0 0  Altered sleeping 0 - -  Tired, decreased energy 0 - -  Change in appetite 0 - -  Feeling bad or failure about yourself  0 - -  Trouble concentrating 0 - -  Moving slowly or fidgety/restless 0 - -  Suicidal thoughts 0 - -  PHQ-9 Score 0 - -    GAD 7 : Generalized Anxiety Score 04/13/2020  Nervous, Anxious, on Edge 0  Control/stop worrying 0  Worry too much - different things 0  Trouble relaxing 0  Restless 0  Easily annoyed or irritable 0  Afraid - awful might happen 0  Total GAD 7 Score 0      All questions at time of visit were answered - patient instructed to contact office with any additional concerns or updates.  ER/RTC precautions were reviewed with the patient.  Please note: voice recognition software was used to produce this document, and typos may escape review. Please contact Dr. Sheppard Coil for any needed clarifications.

## 2020-05-30 HISTORY — PX: ABDOMINOPLASTY: SUR9

## 2020-06-04 DIAGNOSIS — K429 Umbilical hernia without obstruction or gangrene: Secondary | ICD-10-CM | POA: Diagnosis not present

## 2020-06-04 DIAGNOSIS — Z01818 Encounter for other preprocedural examination: Secondary | ICD-10-CM | POA: Diagnosis not present

## 2020-06-08 DIAGNOSIS — K420 Umbilical hernia with obstruction, without gangrene: Secondary | ICD-10-CM | POA: Diagnosis not present

## 2020-06-08 DIAGNOSIS — Z411 Encounter for cosmetic surgery: Secondary | ICD-10-CM | POA: Diagnosis not present

## 2020-06-28 DIAGNOSIS — L578 Other skin changes due to chronic exposure to nonionizing radiation: Secondary | ICD-10-CM | POA: Diagnosis not present

## 2020-06-28 DIAGNOSIS — L814 Other melanin hyperpigmentation: Secondary | ICD-10-CM | POA: Diagnosis not present

## 2020-06-28 DIAGNOSIS — D2261 Melanocytic nevi of right upper limb, including shoulder: Secondary | ICD-10-CM | POA: Diagnosis not present

## 2020-06-28 DIAGNOSIS — D225 Melanocytic nevi of trunk: Secondary | ICD-10-CM | POA: Diagnosis not present

## 2021-01-10 DIAGNOSIS — Z01818 Encounter for other preprocedural examination: Secondary | ICD-10-CM | POA: Diagnosis not present

## 2021-03-08 ENCOUNTER — Other Ambulatory Visit: Payer: Self-pay | Admitting: Obstetrics and Gynecology

## 2021-03-08 DIAGNOSIS — Z1231 Encounter for screening mammogram for malignant neoplasm of breast: Secondary | ICD-10-CM

## 2021-05-03 ENCOUNTER — Other Ambulatory Visit: Payer: Self-pay

## 2021-05-03 ENCOUNTER — Ambulatory Visit
Admission: RE | Admit: 2021-05-03 | Discharge: 2021-05-03 | Disposition: A | Discharge status: Home / Self Care | Payer: BC Managed Care – PPO | Source: Ambulatory Visit | Attending: Obstetrics and Gynecology | Admitting: Obstetrics and Gynecology

## 2021-05-03 DIAGNOSIS — Z1231 Encounter for screening mammogram for malignant neoplasm of breast: Secondary | ICD-10-CM | POA: Diagnosis not present

## 2021-07-28 DIAGNOSIS — L814 Other melanin hyperpigmentation: Secondary | ICD-10-CM | POA: Diagnosis not present

## 2021-07-28 DIAGNOSIS — D225 Melanocytic nevi of trunk: Secondary | ICD-10-CM | POA: Diagnosis not present

## 2021-07-28 DIAGNOSIS — D2261 Melanocytic nevi of right upper limb, including shoulder: Secondary | ICD-10-CM | POA: Diagnosis not present

## 2021-07-28 DIAGNOSIS — L578 Other skin changes due to chronic exposure to nonionizing radiation: Secondary | ICD-10-CM | POA: Diagnosis not present

## 2021-08-05 DIAGNOSIS — J029 Acute pharyngitis, unspecified: Secondary | ICD-10-CM | POA: Diagnosis not present

## 2021-08-05 DIAGNOSIS — Z03818 Encounter for observation for suspected exposure to other biological agents ruled out: Secondary | ICD-10-CM | POA: Diagnosis not present

## 2021-08-05 DIAGNOSIS — Z20822 Contact with and (suspected) exposure to covid-19: Secondary | ICD-10-CM | POA: Diagnosis not present

## 2021-09-10 IMAGING — MG MM DIGITAL SCREENING BILAT W/ TOMO AND CAD
8 series · 8 of 24 positions shown · non-contrast
Comparison: Previous exam(s).

CLINICAL DATA: Screening.

EXAM:
DIGITAL SCREENING BILATERAL MAMMOGRAM WITH TOMOSYNTHESIS AND CAD
TECHNIQUE: Bilateral screening digital craniocaudal and mediolateral oblique
mammograms were obtained. Bilateral screening digital breast
tomosynthesis was performed. The images were evaluated with
computer-aided detection.

[L MLO synth-2D]
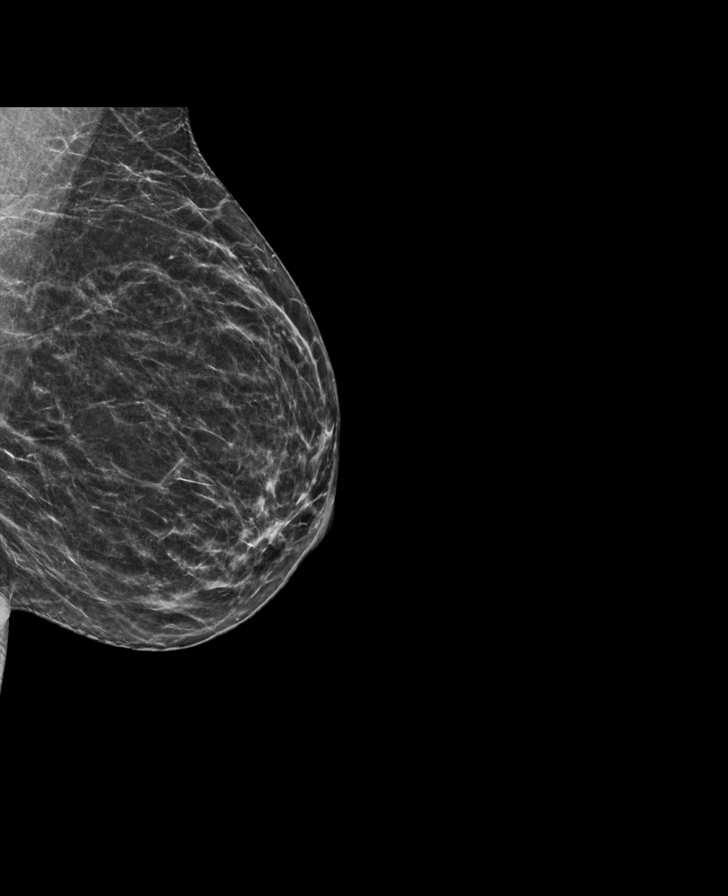

[R MLO synth-2D]
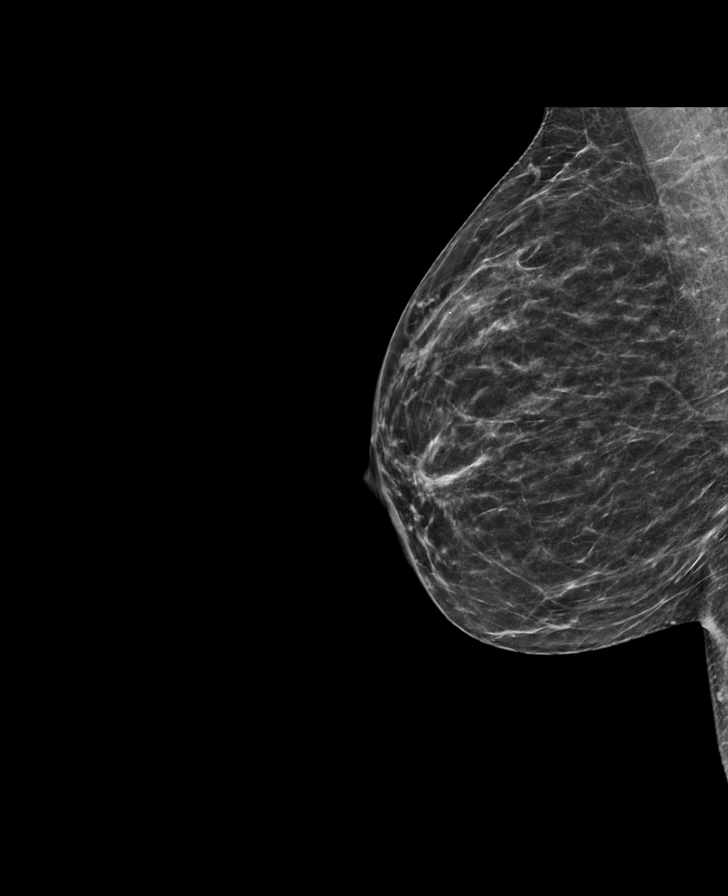

[R CC synth-2D]
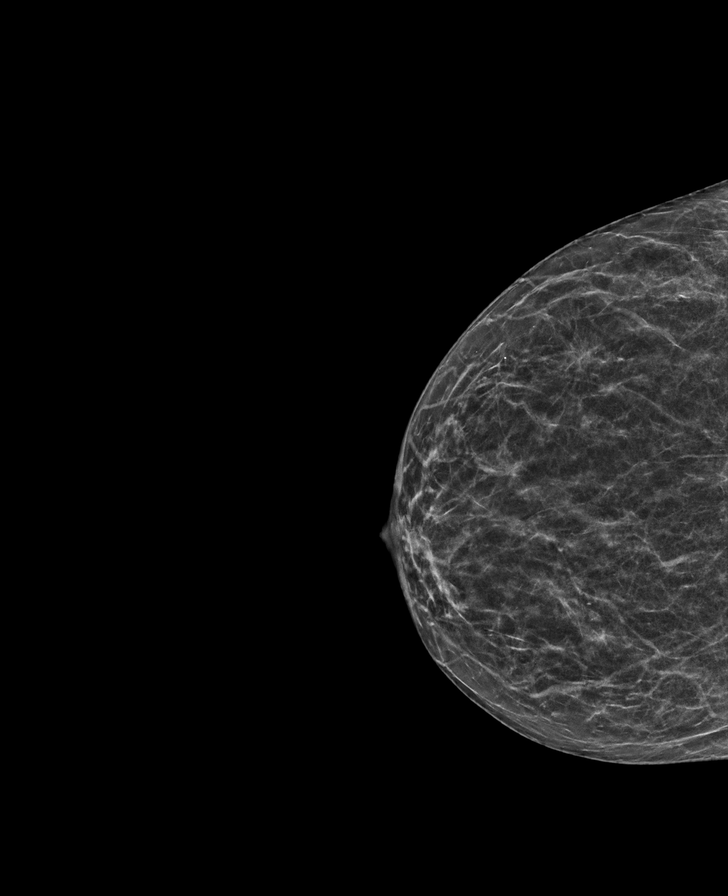

[L CC synth-2D]
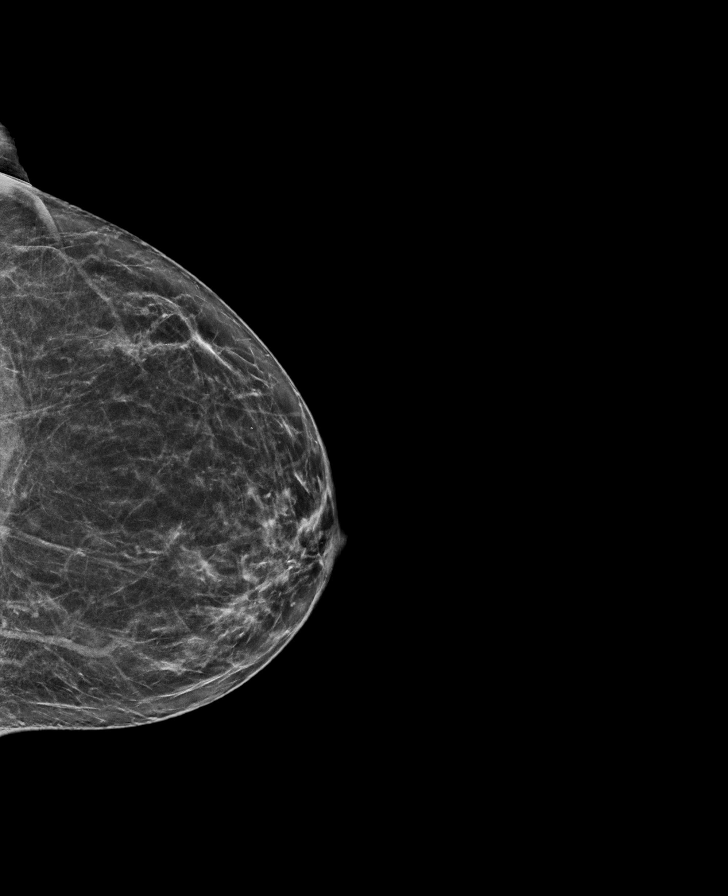

[R CC tomo · tomo slice 26/51.0]
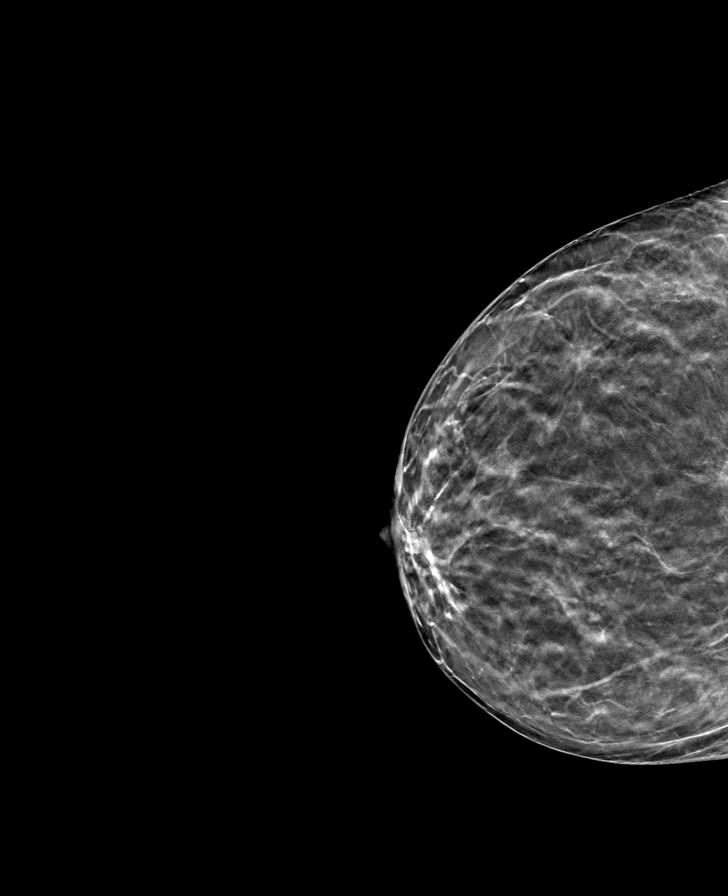

[R MLO tomo · tomo slice 27/53.0]
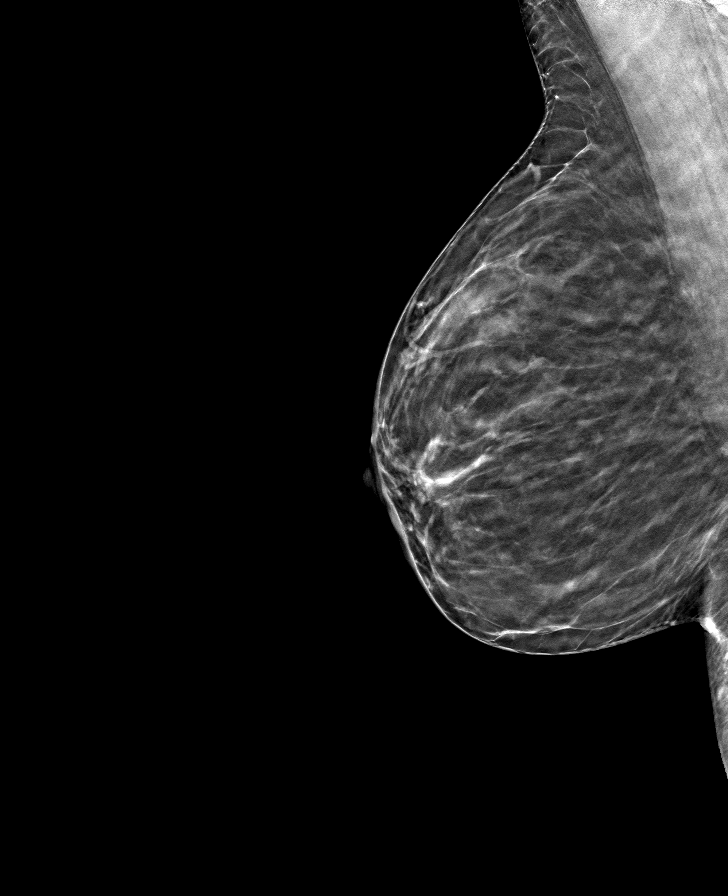

[L MLO tomo · tomo slice 27/54.0]
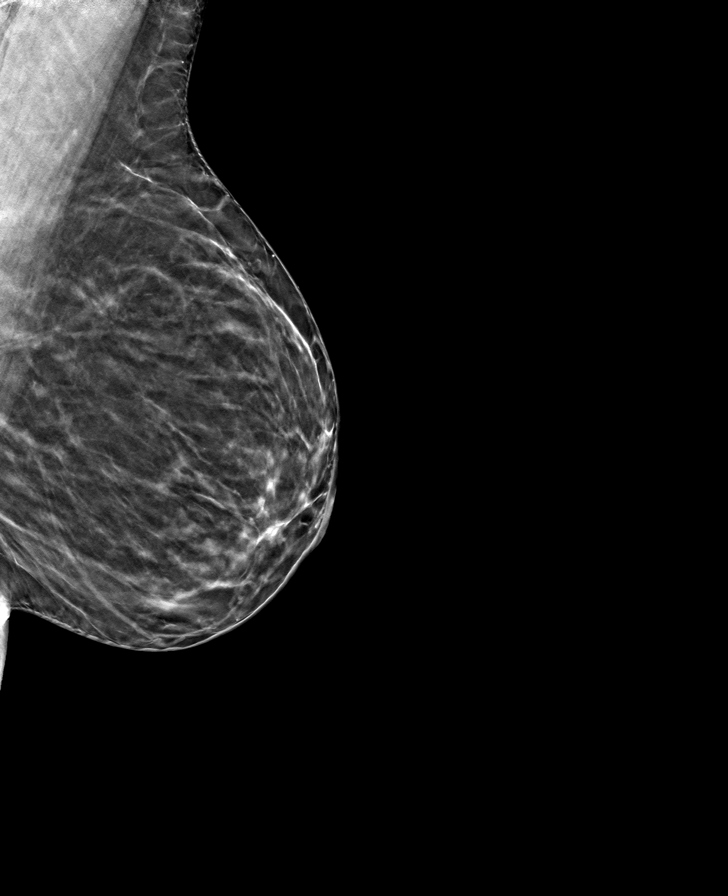

[L CC tomo · tomo slice 30/59.0]
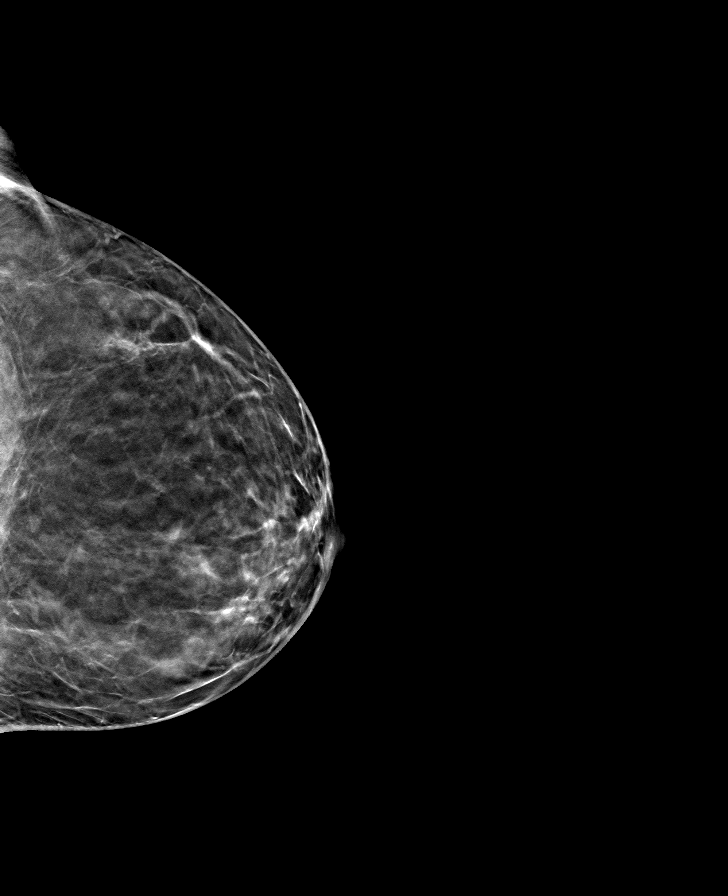

[8 of 24 positions shown; findings below may reference images not displayed]

ACR Breast Density Category b: There are scattered areas of
fibroglandular density.
FINDINGS: There are no findings suspicious for malignancy.
IMPRESSION: No mammographic evidence of malignancy. A result letter of this
screening mammogram will be mailed directly to the patient.

RECOMMENDATION:
Screening mammogram in one year. (Code:51-O-LD2)

BI-RADS CATEGORY  1: Negative.

## 2021-11-16 DIAGNOSIS — J0100 Acute maxillary sinusitis, unspecified: Secondary | ICD-10-CM | POA: Diagnosis not present

## 2022-02-08 NOTE — Progress Notes (Deleted)
?Charlann Boxer D.O. ?[facility] ?[place] ?Phone: [phone] ?Subjective:   ? ?I'm seeing this patient by the request  of:  Emeterio Reeve, DO ? ?CC:  ? ?[id number]  ?[name] is a 46 y.o. female coming in with complaint of hip and LBP. Patient states  ? ? ? ?  ? ?Past Medical History:  ?Diagnosis Date  ? Allergy   ? Asthma   ? Chronic kidney disease   ? stage 1 CKD  ? Hypertension   ? ?Past Surgical History:  ?Procedure Laterality Date  ? CESAREAN SECTION    ? NASAL SINUS SURGERY    ? ?Social History  ? ?Socioeconomic History  ? Marital status: Married  ?  Spouse name: Quita Skye  ? Number of children: 2  ? Years of education: Not on file  ? Highest education level: Not on file  ?Occupational History  ? Not on file  ?Tobacco Use  ? Smoking status: Former  ? Smokeless tobacco: Never  ?Vaping Use  ? Vaping Use: Never used  ?Substance and Sexual Activity  ? Alcohol use: Yes  ?  Comment: 7-8/week  ? Drug use: No  ? Sexual activity: Yes  ?  Partners: Male  ?  Comment: partner s/p vasectomy  ?Other Topics Concern  ? Not on file  ?Social History Narrative  ? Lives with her husband and their two sons, Jeralyn Ruths and Ronnell Guadalajara.  ? ?Social Determinants of Health  ? ?Financial Resource Strain: Not on file  ?Food Insecurity: Not on file  ?Transportation Needs: Not on file  ?Physical Activity: Not on file  ?Stress: Not on file  ?Social Connections: Not on file  ? ?Allergies  ?Allergen Reactions  ? Amoxicillin-Pot Clavulanate Nausea And Vomiting  ? Antihistamines, Diphenhydramine-Type Other (See Comments)  ?  Oral antihistamines makes PT depressed  ? Biaxin [Clarithromycin] Other (See Comments)  ?  Migraines, she takes zpak with no problems  ? ?Family History  ?Problem Relation Age of Onset  ? Hypertension Mother   ? Hypertension Father   ? Heart attack Father   ? Congestive Heart Failure Maternal Grandmother   ? Cancer Maternal Grandfather   ? Dementia Maternal Grandfather   ?  Cancer Paternal Grandmother   ? Cancer Paternal Grandfather   ? Asthma Son   ? ?No current outpatient medications on file. ? ? ?Reviewed prior external information including notes and imaging from  ?primary care provider ?As well as notes that were available from care everywhere and other healthcare systems. ? ?Past medical history, social, surgical and family history all reviewed in electronic medical record.  No pertanent information unless stated regarding to the chief complaint.  ? ?Review of Systems: ? No headache, visual changes, nausea, vomiting, diarrhea, constipation, dizziness, abdominal pain, skin rash, fevers, chills, night sweats, weight loss, swollen lymph nodes, body aches, joint swelling, chest pain, shortness of breath, mood changes. POSITIVE muscle aches ? ?Objective  ?There were no vitals taken for this visit. ?  ?General: No apparent distress alert and oriented x3 mood and affect normal, dressed appropriately.  ?HEENT: Pupils equal, extraocular movements intact  ?Respiratory: Patient's speak in full sentences and does not appear short of breath  ?Cardiovascular: No lower extremity edema, non tender, no erythema  ?Gait normal with good balance and coordination.  ?MSK:  Non tender with full range of motion and good stability and symmetric strength and tone of shoulders, elbows, wrist, hip, knee and ankles bilaterally.  ? ?  ?  Impression and Recommendations:  ?  ? ?The above documentation has been reviewed and is accurate and complete Jacqualin Combes ? ? ?

## 2022-02-13 ENCOUNTER — Ambulatory Visit: Payer: BC Managed Care – PPO | Admitting: Family Medicine

## 2022-02-13 DIAGNOSIS — M25551 Pain in right hip: Secondary | ICD-10-CM | POA: Diagnosis not present

## 2022-02-13 DIAGNOSIS — M545 Low back pain, unspecified: Secondary | ICD-10-CM | POA: Diagnosis not present

## 2022-02-14 ENCOUNTER — Ambulatory Visit: Payer: BC Managed Care – PPO | Admitting: Sports Medicine

## 2022-05-14 DIAGNOSIS — E559 Vitamin D deficiency, unspecified: Secondary | ICD-10-CM | POA: Insufficient documentation

## 2022-05-14 NOTE — Progress Notes (Signed)
ANNUAL EXAM Patient name: [name] MRN [id number]  Date of birth: 12/26/1975 Chief Complaint:   Annual Exam  History of Present Illness:   [name] is a 46 y.o. [phone] female being seen today for a routine annual exam.  Current complaints: She has regular menses but they are heavy when they come. They last 2-3 days and come every 26d. It limits her activities at times because of accidents through pads/tampons. She had considered IUD but has been hesitant to do anything hormonal during the perimenopausal time period. She had considered an ablation some years ago but had not yet been done with child-bearing.  She is now considering it again. Her husband had a vasectomy.   TVUS 03/2020: Normal  Patient's last menstrual period was 05/07/2022.  Last pap 02/2020. Results were: NILM w/ HRHPV negative. H/O abnormal pap: no MXR 04/2021: normal - reports MXR done in May at breast center.   Review of Systems:   Pertinent items are noted in HPI Denies any headaches, blurred vision, fatigue, shortness of breath, chest pain, abdominal pain, abnormal vaginal discharge/itching/odor/irritation, problems with periods, bowel movements, urination, or intercourse unless otherwise stated above.  Pertinent History Reviewed:  Reviewed past medical,surgical, social and family history.  Reviewed problem list, medications and allergies. Physical Assessment:   Vitals:   05/18/22 0900  BP: 109/70  Pulse: 66  Resp: 16  Weight: 150 lb (68 kg)  Height: 5' 4.5" (1.638 m)  Body mass index is 25.35 kg/m.   Physical Examination:  General appearance - well appearing, and in no distress Mental status - alert, oriented to person, place, and time Psych:  She has a normal mood and affect Skin - warm and dry, normal color, no suspicious lesions noted Chest - effort normal Heart - normal rate  Breasts - breasts appear normal, no suspicious masses, no skin or nipple changes or axillary  nodes Abdomen - soft, nontender, nondistended, no masses or organomegaly Pelvic -  VULVA: normal appearing vulva with no masses, tenderness or lesions  VAGINA: normal appearing vagina with normal color and discharge, no lesions   CERVIX: normal appearing cervix without discharge or lesions, no CMT UTERUS: uterus is felt to be normal size, shape, consistency and nontender  ADNEXA: No adnexal masses or tenderness noted. Extremities:  No swelling or varicosities noted  Chaperone present for exam  No results found for this or any previous visit (from the past 24 hour(s)).  Assessment & Plan:  [name] was seen today for annual exam.  Diagnoses and all orders for this visit:  Encounter for annual routine gynecological examination - Cervical cancer screening: Discussed guidelines. Pap with HPV normal 02/2020 - STD Testing: not indicated - Birth Control: Vasectomy - Breast Health: Encouraged self breast awareness/SBE. Teaching provided. Discussed limits of clinical breast exam for detecting breast cancer. Rx given for MXR - F/U 12 months and prn -     MM Digital Screening; Future  Menorrhagia with regular cycle - Discussed r/b/a to endometrial ablation. Reviewed risks of ablation specifically such as starting to bleed again, pain without bleeding as blood is trapped I.e. hematometra, inability to properly assess for endometrial cancer. We reviewed need for EMB prior to ablation to rule this out ahead of time prior to ablation. She is fortunately low risk for endometrial cancer and we discussed the risk factors for it.  -     tranexamic acid (LYSTEDA) 650 MG TABS tablet; Take 2 tablets (1,300 mg total) by mouth 3 (  three) times daily. Take during menses for a maximum of five days  Orders Placed This Encounter  Procedures   MM Digital Screening    Meds:  Meds ordered this encounter  Medications   tranexamic acid (LYSTEDA) 650 MG TABS tablet    Sig: Take 2 tablets (1,300 mg total) by mouth  3 (three) times daily. Take during menses for a maximum of five days    Dispense:  30 tablet    Refill:  2    Follow-up: Return in about 1 year (around 05/19/2023) for annual.  Milas Hock, MD 05/18/2022 9:33 AM

## 2022-05-18 ENCOUNTER — Encounter: Payer: Self-pay | Admitting: Obstetrics and Gynecology

## 2022-05-18 ENCOUNTER — Ambulatory Visit (INDEPENDENT_AMBULATORY_CARE_PROVIDER_SITE_OTHER): Payer: BC Managed Care – PPO | Admitting: Obstetrics and Gynecology

## 2022-05-18 VITALS — BP 109/70 | HR 66 | Resp 16 | Ht 64.5 in | Wt 150.0 lb

## 2022-05-18 DIAGNOSIS — N920 Excessive and frequent menstruation with regular cycle: Secondary | ICD-10-CM | POA: Diagnosis not present

## 2022-05-18 DIAGNOSIS — Z01419 Encounter for gynecological examination (general) (routine) without abnormal findings: Secondary | ICD-10-CM | POA: Diagnosis not present

## 2022-05-18 MED ORDER — TRANEXAMIC ACID 650 MG PO TABS: 1300.0000 mg | ORAL_TABLET | Freq: Three times a day (TID) | ORAL | 2 refills | Status: DC

## 2022-06-06 ENCOUNTER — Telehealth: Payer: Self-pay | Admitting: *Deleted

## 2022-06-06 NOTE — Telephone Encounter (Signed)
Returned call from 2:43 PM. Left patient a message to call the office back to let us know what she needs.

## 2022-06-08 ENCOUNTER — Ambulatory Visit (INDEPENDENT_AMBULATORY_CARE_PROVIDER_SITE_OTHER): Payer: BC Managed Care – PPO | Admitting: Obstetrics and Gynecology

## 2022-06-08 ENCOUNTER — Encounter: Payer: Self-pay | Admitting: Obstetrics and Gynecology

## 2022-06-08 ENCOUNTER — Other Ambulatory Visit (HOSPITAL_COMMUNITY)
Admission: RE | Admit: 2022-06-08 | Discharge: 2022-06-08 | Disposition: A | Discharge status: Home / Self Care | Payer: BC Managed Care – PPO | Source: Ambulatory Visit | Attending: Obstetrics and Gynecology | Admitting: Obstetrics and Gynecology

## 2022-06-08 DIAGNOSIS — N920 Excessive and frequent menstruation with regular cycle: Secondary | ICD-10-CM

## 2022-06-08 DIAGNOSIS — Z01812 Encounter for preprocedural laboratory examination: Secondary | ICD-10-CM | POA: Diagnosis not present

## 2022-06-08 LAB — POCT URINE PREGNANCY: Preg Test, Ur: NEGATIVE

## 2022-06-08 MED ORDER — NORETHIN ACE-ETH ESTRAD-FE 1-20 MG-MCG PO TABS: 1.0000 | ORAL_TABLET | Freq: Every day | ORAL | 0 refills | Status: DC

## 2022-06-08 NOTE — Progress Notes (Signed)
      GYNECOLOGY OFFICE PROCEDURE NOTE   [name] is a 46 y.o. G2P2000 here for endometrial biopsy for AUB. Ultrasound in 2021 was normal.  Today, she reports no concerning symptoms. Of note, pap on 03/24/2020 was normal, negative HPV.   ENDOMETRIAL BIOPSY     The indications for endometrial biopsy were reviewed.   Risks of the biopsy including cramping, bleeding, infection, uterine perforation, inadequate specimen and need for additional procedures were discussed. Offered alternative of hysteroscopy, dilation and curettage in OR. The patient states she understands the R/B/I/A and agrees to undergo procedure today. Urine pregnancy test was Negative. Consent was signed. Time out was performed.    Patient was positioned in dorsal lithotomy position. A vaginal speculum was placed.  The cervix was visualized and was prepped with Betadine.  A single-toothed tenaculum was placed on the anterior lip of the cervix to stabilize it. The 3 mm pipelle was easily introduced into the endometrial cavity without difficulty to a depth of 7 cm, and a Moderate amount of tissue was obtained after two passes and sent to pathology. The instruments were removed from the patient's vagina. Minimal bleeding from the cervix was noted. The patient tolerated the procedure well.   Patient was given post procedure instructions.  Will follow up pathology and manage accordingly; patient will be contacted with results and recommendations.  Routine preventative health maintenance measures emphasized. Will submit message to [facility] for scheduling of endometrial ablation.   She also was hoping to skip next period as she will be travelling. Will send in two pill packs to try menstrual suppression.      Milas Hock, MD, FACOG Obstetrician & Gynecologist, [facility] for [facility], [facility]

## 2022-06-12 LAB — SURGICAL PATHOLOGY

## 2022-06-16 ENCOUNTER — Ambulatory Visit
Admission: RE | Admit: 2022-06-16 | Discharge: 2022-06-16 | Disposition: A | Discharge status: Home / Self Care | Payer: BC Managed Care – PPO | Source: Ambulatory Visit | Attending: Obstetrics and Gynecology | Admitting: Obstetrics and Gynecology

## 2022-06-16 DIAGNOSIS — Z01419 Encounter for gynecological examination (general) (routine) without abnormal findings: Secondary | ICD-10-CM

## 2022-06-16 DIAGNOSIS — Z1231 Encounter for screening mammogram for malignant neoplasm of breast: Secondary | ICD-10-CM | POA: Diagnosis not present

## 2022-07-25 ENCOUNTER — Telehealth: Payer: Self-pay | Admitting: *Deleted

## 2022-07-25 NOTE — Telephone Encounter (Signed)
ERROR

## 2022-07-25 NOTE — Telephone Encounter (Signed)
-----   Message from Francia Greaves sent at 06/27/2022  2:26 PM EDT ----- Regarding: Surgery 10/11 Rescheduled to 08/09/2022 @1400  w/ Damita Dunnings ----- Message ----- From: Francia Greaves Sent: 06/14/2022   2:15 PM EDT To: Elmon Else, NT; Nathaneil Canary; # Subject: Surgery 09/20                                  Done. Posted for 07/19/2022 @1000  w/ Damita Dunnings CPT 86767 Dx [phone] ----- Message ----- From: Radene Gunning, MD Sent: 06/08/2022   1:39 PM EDT To: Francia Greaves Subject: Surgery                                        Surgery: D&C, hysteroscopy, endometrial ablation  Rough timing: whenever Assistant request: None I do a novasure, fyi  Thanks!   Nevin Bloodgood

## 2022-07-25 NOTE — Telephone Encounter (Signed)
Returned call from 3:53 PM. Patient given contact information for surgery scheduler to discuss date change of surgery.

## 2022-07-28 ENCOUNTER — Encounter (HOSPITAL_BASED_OUTPATIENT_CLINIC_OR_DEPARTMENT_OTHER): Payer: Self-pay | Admitting: Obstetrics and Gynecology

## 2022-08-09 ENCOUNTER — Ambulatory Visit (HOSPITAL_BASED_OUTPATIENT_CLINIC_OR_DEPARTMENT_OTHER)
Admission: RE | Admit: 2022-08-09 | Payer: BC Managed Care – PPO | Source: Ambulatory Visit | Admitting: Obstetrics and Gynecology

## 2022-08-09 ENCOUNTER — Telehealth: Payer: Self-pay

## 2022-08-09 HISTORY — DX: Personal history of other complications of pregnancy, childbirth and the puerperium: Z87.59

## 2022-08-09 HISTORY — DX: Abnormal uterine and vaginal bleeding, unspecified: N93.9

## 2022-08-09 SURGERY — DILATATION & CURETTAGE/HYSTEROSCOPY WITH NOVASURE ABLATION
Anesthesia: Choice

## 2022-08-09 NOTE — Telephone Encounter (Signed)
Called patient about getting her surgery rescheduled and potential dates, no answer,left voicemail instructing her to return my call.

## 2022-08-15 DIAGNOSIS — Z23 Encounter for immunization: Secondary | ICD-10-CM | POA: Diagnosis not present

## 2022-08-15 DIAGNOSIS — N924 Excessive bleeding in the premenopausal period: Secondary | ICD-10-CM | POA: Diagnosis not present

## 2022-08-17 ENCOUNTER — Telehealth: Payer: Self-pay

## 2022-08-17 NOTE — Telephone Encounter (Signed)
Called patient to discuss potential surgery dates, patient opted for 12/6

## 2022-09-29 ENCOUNTER — Encounter (HOSPITAL_BASED_OUTPATIENT_CLINIC_OR_DEPARTMENT_OTHER): Payer: Self-pay | Admitting: Obstetrics and Gynecology

## 2022-10-02 ENCOUNTER — Encounter (HOSPITAL_BASED_OUTPATIENT_CLINIC_OR_DEPARTMENT_OTHER): Payer: Self-pay | Admitting: Obstetrics and Gynecology

## 2022-10-02 NOTE — Progress Notes (Signed)
Spoke w/ via phone for pre-op interview--- pt Lab needs dos----  cbc, t&s, urine preg             Lab results------ no COVID test -----patient states asymptomatic no test needed Arrive at ------- 1115 on 10-04-2022 NPO after MN NO Solid Food.  Clear liquids from MN until--- 1015 Med rec completed Medications to take morning of surgery ----- none Diabetic medication ----- n/a Patient instructed no nail polish to be worn day of surgery Patient instructed to bring photo id and insurance card day of surgery Patient aware to have Driver (ride ) / caregiver for 24 hours after surgery -- husband, adam Patient Special Instructions ----- n/a Pre-Op special Istructions ----- n/a Patient verbalized understanding of instructions that were given at this phone interview. Patient denies shortness of breath, chest pain, fever, cough at this phone interview.

## 2022-10-04 ENCOUNTER — Ambulatory Visit (HOSPITAL_BASED_OUTPATIENT_CLINIC_OR_DEPARTMENT_OTHER): Payer: BC Managed Care – PPO | Admitting: Anesthesiology

## 2022-10-04 ENCOUNTER — Ambulatory Visit (HOSPITAL_BASED_OUTPATIENT_CLINIC_OR_DEPARTMENT_OTHER)
Admission: RE | Admit: 2022-10-04 | Discharge: 2022-10-04 | Disposition: A | Discharge status: Home / Self Care | Payer: BC Managed Care – PPO | Attending: Obstetrics and Gynecology | Admitting: Obstetrics and Gynecology

## 2022-10-04 ENCOUNTER — Encounter (HOSPITAL_BASED_OUTPATIENT_CLINIC_OR_DEPARTMENT_OTHER)
Admission: RE | Disposition: A | Discharge status: Home / Self Care | Payer: Self-pay | Source: Home / Self Care | Attending: Obstetrics and Gynecology

## 2022-10-04 ENCOUNTER — Encounter (HOSPITAL_BASED_OUTPATIENT_CLINIC_OR_DEPARTMENT_OTHER): Payer: Self-pay | Admitting: Obstetrics and Gynecology

## 2022-10-04 ENCOUNTER — Other Ambulatory Visit: Payer: Self-pay

## 2022-10-04 DIAGNOSIS — Z8742 Personal history of other diseases of the female genital tract: Secondary | ICD-10-CM

## 2022-10-04 DIAGNOSIS — Z87891 Personal history of nicotine dependence: Secondary | ICD-10-CM | POA: Diagnosis not present

## 2022-10-04 DIAGNOSIS — Z01818 Encounter for other preprocedural examination: Secondary | ICD-10-CM

## 2022-10-04 DIAGNOSIS — N939 Abnormal uterine and vaginal bleeding, unspecified: Secondary | ICD-10-CM | POA: Diagnosis not present

## 2022-10-04 DIAGNOSIS — N920 Excessive and frequent menstruation with regular cycle: Secondary | ICD-10-CM | POA: Insufficient documentation

## 2022-10-04 HISTORY — DX: Other allergy status, other than to drugs and biological substances: Z91.09

## 2022-10-04 HISTORY — DX: Presence of spectacles and contact lenses: Z97.3

## 2022-10-04 HISTORY — DX: Unspecified osteoarthritis, unspecified site: M19.90

## 2022-10-04 HISTORY — PX: DILITATION & CURRETTAGE/HYSTROSCOPY WITH NOVASURE ABLATION: SHX5568

## 2022-10-04 HISTORY — DX: Excessive and frequent menstruation with regular cycle: N92.0

## 2022-10-04 LAB — CBC
HCT: 45.5 % (ref 36.0–46.0)
Hemoglobin: 15.2 g/dL — ABNORMAL HIGH (ref 12.0–15.0)
MCH: 30.3 pg (ref 26.0–34.0)
MCHC: 33.4 g/dL (ref 30.0–36.0)
MCV: 90.6 fL (ref 80.0–100.0)
Platelets: 190 10*3/uL (ref 150–400)
RBC: 5.02 MIL/uL (ref 3.87–5.11)
RDW: 12.3 % (ref 11.5–15.5)
WBC: 7.6 10*3/uL (ref 4.0–10.5)
nRBC: 0 % (ref 0.0–0.2)

## 2022-10-04 LAB — ABO/RH: ABO/RH(D): A POS

## 2022-10-04 LAB — TYPE AND SCREEN
ABO/RH(D): A POS
Antibody Screen: NEGATIVE

## 2022-10-04 LAB — POCT PREGNANCY, URINE: Preg Test, Ur: NEGATIVE

## 2022-10-04 SURGERY — DILATATION & CURETTAGE/HYSTEROSCOPY WITH NOVASURE ABLATION
Anesthesia: General | Site: Uterus

## 2022-10-04 MED ORDER — PROMETHAZINE HCL 25 MG/ML IJ SOLN: 6.2500 mg | INTRAMUSCULAR | Status: DC | PRN

## 2022-10-04 MED ORDER — SODIUM CHLORIDE 0.9 % IR SOLN
Status: DC | PRN
  Administered 2022-10-04: 3000 mL

## 2022-10-04 MED ORDER — ACETAMINOPHEN 10 MG/ML IV SOLN: 1000.0000 mg | Freq: Once | INTRAVENOUS | Status: DC | PRN

## 2022-10-04 MED ORDER — KETOROLAC TROMETHAMINE 30 MG/ML IJ SOLN
INTRAMUSCULAR | Status: AC
  Filled 2022-10-04: qty 3

## 2022-10-04 MED ORDER — FENTANYL CITRATE (PF) 100 MCG/2ML IJ SOLN
INTRAMUSCULAR | Status: DC | PRN
  Administered 2022-10-04: 25 ug via INTRAVENOUS

## 2022-10-04 MED ORDER — ACETAMINOPHEN 500 MG PO TABS
ORAL_TABLET | ORAL | Status: AC
  Filled 2022-10-04: qty 2

## 2022-10-04 MED ORDER — LIDOCAINE HCL (PF) 2 % IJ SOLN
INTRAMUSCULAR | Status: AC
  Filled 2022-10-04: qty 10

## 2022-10-04 MED ORDER — OXYCODONE HCL 5 MG PO TABS: 5.0000 mg | ORAL_TABLET | Freq: Once | ORAL | Status: DC | PRN

## 2022-10-04 MED ORDER — ACETAMINOPHEN 500 MG PO TABS
1000.0000 mg | ORAL_TABLET | ORAL | Status: AC
  Administered 2022-10-04: 1000 mg via ORAL

## 2022-10-04 MED ORDER — KETOROLAC TROMETHAMINE 30 MG/ML IJ SOLN
INTRAMUSCULAR | Status: DC | PRN
  Administered 2022-10-04: 30 mg via INTRAVENOUS

## 2022-10-04 MED ORDER — LACTATED RINGERS IV SOLN: INTRAVENOUS | Status: DC

## 2022-10-04 MED ORDER — PROPOFOL 10 MG/ML IV BOLUS
INTRAVENOUS | Status: DC | PRN
  Administered 2022-10-04: 170 mg via INTRAVENOUS

## 2022-10-04 MED ORDER — AMISULPRIDE (ANTIEMETIC) 5 MG/2ML IV SOLN: 10.0000 mg | Freq: Once | INTRAVENOUS | Status: DC | PRN

## 2022-10-04 MED ORDER — SCOPOLAMINE 1 MG/3DAYS TD PT72
1.0000 | MEDICATED_PATCH | TRANSDERMAL | Status: DC
  Administered 2022-10-04: 1.5 mg via TRANSDERMAL

## 2022-10-04 MED ORDER — IBUPROFEN 600 MG PO TABS: 600.0000 mg | ORAL_TABLET | Freq: Four times a day (QID) | ORAL | 0 refills | Status: DC | PRN

## 2022-10-04 MED ORDER — LIDOCAINE HCL (CARDIAC) PF 100 MG/5ML IV SOSY
PREFILLED_SYRINGE | INTRAVENOUS | Status: DC | PRN
  Administered 2022-10-04: 50 mg via INTRAVENOUS

## 2022-10-04 MED ORDER — FENTANYL CITRATE (PF) 100 MCG/2ML IJ SOLN: 25.0000 ug | INTRAMUSCULAR | Status: DC | PRN

## 2022-10-04 MED ORDER — POVIDONE-IODINE 10 % EX SWAB: 2.0000 | Freq: Once | CUTANEOUS | Status: DC

## 2022-10-04 MED ORDER — ACETAMINOPHEN 325 MG PO TABS: 325.0000 mg | ORAL_TABLET | ORAL | Status: DC | PRN

## 2022-10-04 MED ORDER — GLYCOPYRROLATE PF 0.2 MG/ML IJ SOSY
PREFILLED_SYRINGE | INTRAMUSCULAR | Status: AC
  Filled 2022-10-04: qty 1

## 2022-10-04 MED ORDER — KETOROLAC TROMETHAMINE 15 MG/ML IJ SOLN: 15.0000 mg | INTRAMUSCULAR | Status: DC

## 2022-10-04 MED ORDER — ACETAMINOPHEN 160 MG/5ML PO SOLN: 325.0000 mg | ORAL | Status: DC | PRN

## 2022-10-04 MED ORDER — FENTANYL CITRATE (PF) 100 MCG/2ML IJ SOLN
INTRAMUSCULAR | Status: AC
  Filled 2022-10-04: qty 2

## 2022-10-04 MED ORDER — MIDAZOLAM HCL 2 MG/2ML IJ SOLN
INTRAMUSCULAR | Status: AC
  Filled 2022-10-04: qty 2

## 2022-10-04 MED ORDER — MIDAZOLAM HCL 2 MG/2ML IJ SOLN
INTRAMUSCULAR | Status: DC | PRN
  Administered 2022-10-04: 2 mg via INTRAVENOUS

## 2022-10-04 MED ORDER — OXYCODONE HCL 5 MG PO TABS: 5.0000 mg | ORAL_TABLET | ORAL | 0 refills | Status: DC | PRN

## 2022-10-04 MED ORDER — ROCURONIUM BROMIDE 10 MG/ML (PF) SYRINGE
PREFILLED_SYRINGE | INTRAVENOUS | Status: AC
  Filled 2022-10-04: qty 10

## 2022-10-04 MED ORDER — DEXAMETHASONE SODIUM PHOSPHATE 4 MG/ML IJ SOLN
INTRAMUSCULAR | Status: DC | PRN
  Administered 2022-10-04: 8 mg via INTRAVENOUS

## 2022-10-04 MED ORDER — ONDANSETRON HCL 4 MG/2ML IJ SOLN
INTRAMUSCULAR | Status: AC
  Filled 2022-10-04: qty 2

## 2022-10-04 MED ORDER — OXYCODONE HCL 5 MG/5ML PO SOLN: 5.0000 mg | Freq: Once | ORAL | Status: DC | PRN

## 2022-10-04 MED ORDER — ONDANSETRON HCL 4 MG/2ML IJ SOLN
INTRAMUSCULAR | Status: DC | PRN
  Administered 2022-10-04: 4 mg via INTRAVENOUS

## 2022-10-04 MED ORDER — SCOPOLAMINE 1 MG/3DAYS TD PT72
MEDICATED_PATCH | TRANSDERMAL | Status: AC
  Filled 2022-10-04: qty 1

## 2022-10-04 SURGICAL SUPPLY — 12 items
ABLATOR SURESOUND NOVASURE (ABLATOR) ×1 IMPLANT
CATH ROBINSON RED A/P 16FR (CATHETERS) IMPLANT
DILATOR CANAL MILEX (MISCELLANEOUS) IMPLANT
ELECT REM PT RETURN 9FT ADLT (ELECTROSURGICAL)
ELECTRODE REM PT RTRN 9FT ADLT (ELECTROSURGICAL) IMPLANT
GLOVE BIO SURGEON STRL SZ 6 (GLOVE) ×1 IMPLANT
GOWN STRL REUS W/TWL LRG LVL3 (GOWN DISPOSABLE) ×1 IMPLANT
KIT PROCEDURE FLUENT (KITS) ×1 IMPLANT
PACK VAGINAL MINOR WOMEN LF (CUSTOM PROCEDURE TRAY) ×1 IMPLANT
PAD OB MATERNITY 4.3X12.25 (PERSONAL CARE ITEMS) ×1 IMPLANT
SEAL ROD LENS SCOPE MYOSURE (ABLATOR) ×1 IMPLANT
TOWEL OR 17X26 10 PK STRL BLUE (TOWEL DISPOSABLE) ×1 IMPLANT

## 2022-10-04 NOTE — Anesthesia Procedure Notes (Signed)
Procedure Name: LMA Insertion Date/Time: 10/04/2022 12:32 PM  Performed by: Earmon Phoenix, CRNAPre-anesthesia Checklist: Patient identified, Emergency Drugs available, Suction available, Patient being monitored and Timeout performed Patient Re-evaluated:Patient Re-evaluated prior to induction Oxygen Delivery Method: Circle system utilized Preoxygenation: Pre-oxygenation with 100% oxygen Induction Type: IV induction Ventilation: Mask ventilation without difficulty LMA: LMA inserted LMA Size: 3.0 Number of attempts: 1 Placement Confirmation: breath sounds checked- equal and bilateral, CO2 detector and positive ETCO2 Tube secured with: Tape Dental Injury: Teeth and Oropharynx as per pre-operative assessment

## 2022-10-04 NOTE — H&P (Signed)
[facility]ulty [facility] Obstetrics and Gynecology Attending History and Physical  [name] is a 46 y.o. G2P2000 who presents today for endometrial ablation.   She has regular menses but they are heavy when they come. They last 2-3 days and come every 26d. It limits her activities at times because of accidents through pads/tampons. She had considered IUD but has been hesitant to do anything hormonal during the perimenopausal time period. She had considered an ablation some years ago but had not yet been done with child-bearing. She is now considering it again. Her husband had a vasectomy.   EMB was normal in August without hyperplasia or malignancy.   Past Medical History:  Diagnosis Date   Abnormal uterine bleeding (AUB)    Arthritis    FEET   Environmental allergies    Heavy menstrual bleeding    History of pregnancy induced hypertension    Wears contact lenses    Past Surgical History:  Procedure Laterality Date   ABDOMINOPLASTY  05/2020   per PT THEN HAD REVISION 04/ 2022   CESAREAN SECTION     11-30-2005;    @[facility]  by dr  10-28-2007   NASAL SINUS SURGERY  1995   OB History  Gravida Para Term Preterm AB Living  2 2 2         SAB IAB Ectopic Multiple Live Births          2    # Outcome Date GA Lbr Len/2nd Weight Sex Delivery Anes PTL Lv  2 Term      CS-LTranv     1 Term      CS-LTranv     Patient denies any other pertinent gynecologic issues.  No current facility-administered medications on file prior to encounter.   Current Outpatient Medications on File Prior to Encounter  Medication Sig Dispense Refill   AMINO ACID SUPER FORMULA PO Take by mouth daily.     Multiple Vitamin (MULTIVITAMIN) capsule Take 1 capsule by mouth daily.     oxymetazoline (AFRIN) 0.05 % nasal spray Place 1 spray into both nostrils as needed for congestion.     tranexamic acid (LYSTEDA) 650 MG TABS tablet Take 2 tablets (1,300 mg total) by mouth 3 (three) times daily. Take during menses for a  maximum of five days (Patient not taking: Reported on 10/02/2022) 30 tablet 2   Allergies  Allergen Reactions   Biaxin [Clarithromycin] Other (See Comments)    Migraines, she takes zpak with no problems Other reaction(s): headache   Antihistamines, Diphenhydramine-Type Other (See Comments)    Oral antihistamines makes PT depressed   Augmentin [Amoxicillin-Pot Clavulanate] Diarrhea    Social History:   reports that she quit smoking about 25 years ago. Her smoking use included cigarettes. She has never used smokeless tobacco. She reports current alcohol use of about 2.0 standard drinks of alcohol per week. She reports that she does not use drugs. Family History  Problem Relation Age of Onset   Hypertension Mother    Hypertension Father    Heart attack Father    Congestive Heart Failure Maternal Grandmother    Cancer Maternal Grandfather    Dementia Maternal Grandfather    Cancer Paternal Grandmother    Cancer Paternal Grandfather    Asthma Son    Breast cancer Neg Hx     Review of Systems: Pertinent items noted in HPI and remainder of comprehensive ROS otherwise negative.  PHYSICAL EXAM: Blood pressure (!) 130/92, pulse 77, temperature (!) 97.2 F (36.2 C),  temperature source Oral, resp. rate 15, height 5\' 4"  (1.626 m), weight 67.6 kg, last menstrual period 09/26/2022, SpO2 100 %. CONSTITUTIONAL: Well-developed, well-nourished female in no acute distress.  HENT:  Normocephalic, atraumatic, External right and left ear normal. Oropharynx is clear and moist EYES: Conjunctivae and EOM are normal. Pupils are equal, round, and reactive to light. No scleral icterus.  NECK: Normal range of motion, supple, no masses SKIN: Skin is warm and dry. No rash noted. Not diaphoretic. No erythema. No pallor. NEUROLOGIC: Alert and oriented to person, place, and time. Normal reflexes, muscle tone coordination. No cranial nerve deficit noted. PSYCHIATRIC: Normal mood and affect. Normal behavior. Normal  judgment and thought content. CARDIOVASCULAR: Normal heart rate noted, regular rhythm RESPIRATORY: Effort and breath sounds normal, no problems with respiration noted ABDOMEN: Soft, nontender, nondistended. PELVIC: Deferred MUSCULOSKELETAL: Normal range of motion. No tenderness.  No cyanosis, clubbing, or edema.  2+ distal pulses.  Labs: Results for orders placed or performed during the hospital encounter of 10/04/22 (from the past 336 hour(s))  Pregnancy, urine POC   Collection Time: 10/04/22 11:32 AM  Result Value Ref Range   Preg Test, Ur NEGATIVE NEGATIVE    Imaging Studies: No results found.  Assessment: Active Problems:   History of heavy vaginal bleeding   Plan: - Previously r/b/a to endometrial ablation. Reviewed risks of ablation specifically such as starting to bleed again, pain without bleeding as blood is trapped I.e. hematometra, inability to properly assess for endometrial cancer.  - Discussed recovery and limitations following surgery.  - Consent signed - All questions answered     14/06/23, MD, FACOG Obstetrician & Gynecologist, [facility] for [facility], [facility]

## 2022-10-04 NOTE — Anesthesia Postprocedure Evaluation (Signed)
Anesthesia Post Note  Patient: [name]  Procedure(s) Performed: DILATATION & CURETTAGE/HYSTEROSCOPY WITH NOVASURE ABLATION (Uterus)     Patient location during evaluation: PACU Anesthesia Type: General Level of consciousness: awake and alert Pain management: pain level controlled Vital Signs Assessment: post-procedure vital signs reviewed and stable Respiratory status: spontaneous breathing, nonlabored ventilation, respiratory function stable and patient connected to nasal cannula oxygen Cardiovascular status: blood pressure returned to baseline and stable Postop Assessment: no apparent nausea or vomiting Anesthetic complications: no   No notable events documented.  Last Vitals:  Vitals:   10/04/22 1330 10/04/22 1400  BP: 129/85 127/85  Pulse: (!) 52 60  Resp: 12 15  Temp: 36.5 C 36.5 C  SpO2: 100% 100%    Last Pain:  Vitals:   10/04/22 1400  TempSrc:   PainSc: 0-No pain                 Shelton Silvas

## 2022-10-04 NOTE — Transfer of Care (Signed)
Immediate Anesthesia Transfer of Care Note  Patient: [name]  Procedure(s) Performed: DILATATION & CURETTAGE/HYSTEROSCOPY WITH NOVASURE ABLATION (Uterus)  Patient Location: PACU  Anesthesia Type:General  Level of Consciousness: awake, alert , oriented, and patient cooperative  Airway & Oxygen Therapy: Patient Spontanous Breathing and Patient connected to nasal cannula oxygen  Post-op Assessment: Report given to RN and Post -op Vital signs reviewed and stable  Post vital signs: Reviewed and stable  Last Vitals:  Vitals Value Taken Time  BP 134/81 10/04/22 1306  Temp 36.6 C 10/04/22 1306  Pulse 59 10/04/22 1307  Resp 12 10/04/22 1307  SpO2 99 % 10/04/22 1307  Vitals shown include unvalidated device data.  Last Pain:  Vitals:   10/04/22 1145  TempSrc: Oral  PainSc: 0-No pain      Patients Stated Pain Goal: 5 (10/04/22 1145)  Complications: No notable events documented.

## 2022-10-04 NOTE — Anesthesia Preprocedure Evaluation (Addendum)
Anesthesia Evaluation  Patient identified by MRN, date of birth, ID band Patient awake    Reviewed: Allergy & Precautions, NPO status , Patient's Chart, lab work & pertinent test results  Airway Mallampati: II  TM Distance: >3 FB Neck ROM: Full    Dental  (+) Teeth Intact, Dental Advisory Given   Pulmonary former smoker   breath sounds clear to auscultation       Cardiovascular negative cardio ROS  Rhythm:Regular Rate:Normal     Neuro/Psych negative neurological ROS  negative psych ROS   GI/Hepatic negative GI ROS, Neg liver ROS,,,  Endo/Other  negative endocrine ROS    Renal/GU negative Renal ROS     Musculoskeletal  (+) Arthritis ,    Abdominal   Peds  Hematology negative hematology ROS (+)   Anesthesia Other Findings   Reproductive/Obstetrics                             Anesthesia Physical Anesthesia Plan  ASA: 2  Anesthesia Plan: General   Post-op Pain Management:    Induction: Intravenous  PONV Risk Score and Plan: 4 or greater and Ondansetron, Dexamethasone, Midazolam and Scopolamine patch - Pre-op  Airway Management Planned: LMA  Additional Equipment: None  Intra-op Plan:   Post-operative Plan: Extubation in OR  Informed Consent: I have reviewed the patients History and Physical, chart, labs and discussed the procedure including the risks, benefits and alternatives for the proposed anesthesia with the patient or authorized representative who has indicated his/her understanding and acceptance.     Dental advisory given  Plan Discussed with: CRNA  Anesthesia Plan Comments:        Anesthesia Quick Evaluation

## 2022-10-04 NOTE — Op Note (Signed)
[name] PROCEDURE DATE: 10/04/2022   PREOPERATIVE DIAGNOSIS:  Menorrhagia  POSTOPERATIVE DIAGNOSIS:  Same  PROCEDURE:  D&C, hysteroscopy, Novasure endometrial ablation  SURGEON:  Dr. Milas Hock  ASSISTANT:  None  ANESTHESIA:  LMA  COMPLICATIONS:  None immediate.  ESTIMATED BLOOD LOSS:  5 ml.  FLUIDS: 600 ml LR.  URINE OUTPUT:  None - not drained  INDICATIONS: 46 y.o. [id number] with heavy menstrual bleeding    FINDINGS:  NEFG, normal appearing cervix. Normal proliferative endometrial cavity without defects. Ostia visualized bilaterally. Homogenous char noted after procedure.   TECHNIQUE:  The patient was taken to the operating room where LMA anesthesia was obtained without difficulty.  She was then placed in the dorsal lithotomy position and prepared and draped in sterile fashion.  After an adequate timeout was performed, a bivalved speculum was then placed in the patient's vagina, and the anterior lip of cervix grasped with the single-tooth tenaculum.  The cervix was dilated with Shawnie Pons dilators. Hysteroscope inserted and cavity had aforementioned findings. Uterine sound was 9 cm. Cervical length was 4 cm giving cavity length of 5. Novasure device inserted and cavity width found to be 4.5. Cavity assessment done and assured. Device activated. Power was 127 and length of ablation was 1:29. Device removed once completed and hysteroscope inserted and homogenous char noted. All instruments removed and procedure was ended.   The patient will be discharged to home as per PACU criteria.  Routine postoperative instructions given.  Milas Hock, MD Attending Obstetrician & Gynecologist, [facility] for [facility], [facility]

## 2022-10-04 NOTE — Discharge Instructions (Signed)
No tylenol until 5:45 p.m.

## 2022-10-05 ENCOUNTER — Encounter (HOSPITAL_BASED_OUTPATIENT_CLINIC_OR_DEPARTMENT_OTHER): Payer: Self-pay | Admitting: Obstetrics and Gynecology

## 2022-10-06 LAB — SURGICAL PATHOLOGY

## 2023-01-11 DIAGNOSIS — Z0000 Encounter for general adult medical examination without abnormal findings: Secondary | ICD-10-CM | POA: Diagnosis not present

## 2023-01-18 DIAGNOSIS — Z0000 Encounter for general adult medical examination without abnormal findings: Secondary | ICD-10-CM | POA: Diagnosis not present

## 2023-01-18 DIAGNOSIS — Z1339 Encounter for screening examination for other mental health and behavioral disorders: Secondary | ICD-10-CM | POA: Diagnosis not present

## 2023-01-18 DIAGNOSIS — Z1331 Encounter for screening for depression: Secondary | ICD-10-CM | POA: Diagnosis not present

## 2023-02-02 ENCOUNTER — Ambulatory Visit (INDEPENDENT_AMBULATORY_CARE_PROVIDER_SITE_OTHER): Payer: BC Managed Care – PPO | Admitting: Certified Nurse Midwife

## 2023-02-02 ENCOUNTER — Encounter: Payer: Self-pay | Admitting: Certified Nurse Midwife

## 2023-02-02 VITALS — BP 124/71 | HR 75 | Resp 16 | Ht 64.5 in | Wt 147.0 lb

## 2023-02-02 DIAGNOSIS — B372 Candidiasis of skin and nail: Secondary | ICD-10-CM

## 2023-02-02 DIAGNOSIS — R102 Pelvic and perineal pain: Secondary | ICD-10-CM | POA: Diagnosis not present

## 2023-02-02 MED ORDER — NYSTATIN 100000 UNIT/GM EX POWD: 1.0000 | Freq: Three times a day (TID) | CUTANEOUS | 0 refills | Status: DC

## 2023-02-02 NOTE — Progress Notes (Addendum)
GYNECOLOGY OFFICE VISIT NOTE  History:  47 y.o. G2P2000 here today for pelvic pain. Reports onset of sharp intermittent pain in her right lower pelvic area about 1 month ago. Denies urinary sx. Denies vaginal discharge, itching, or malodor. Periods are minimal since ablation, having just a few hours of bleeding each month with minimal cramping. No dyspareunia. No new partner or concern for STI. Contraception: partner vasectomy. Also reports rectal itching. Wear leggings a lot. Has had a rectal skin tag for years, never told it was a hemorrhoid, followed by derm. She tried yeast cream but was inconsistent.  Past Medical History:  Diagnosis Date   Abnormal uterine bleeding (AUB)    Arthritis    FEET   Environmental allergies    Heavy menstrual bleeding    History of pregnancy induced hypertension    Wears contact lenses     Past Surgical History:  Procedure Laterality Date   ABDOMINOPLASTY  05/2020   per PT THEN HAD REVISION 04/ 2022   CESAREAN SECTION     11-30-2005;    @[facility]  by dr Estanislado Pandyrivard  10-28-2007   DILITATION & CURRETTAGE/HYSTROSCOPY WITH NOVASURE ABLATION N/A 10/04/2022   Procedure: DILATATION & CURETTAGE/HYSTEROSCOPY WITH NOVASURE ABLATION;  Surgeon: Milas Hockuncan, Paula, MD;  [facility];  Service: Gynecology;  Laterality: N/A;   NASAL SINUS SURGERY  1995    Family History  Problem Relation Age of Onset   Hypertension Mother    Hypertension Father    Heart attack Father    Congestive Heart Failure Maternal Grandmother    Cancer Maternal Grandfather    Dementia Maternal Grandfather    Cancer Paternal Grandmother    Cancer Paternal Grandfather    Asthma Son    Breast cancer Neg Hx     Social History   Socioeconomic History   Marital status: Married    Spouse name: Adam   Number of children: 2   Years of education: Not on file   Highest education level: Not on file  Occupational History   Not on file  Tobacco Use   Smoking status: Former     Years: 4    Types: Cigarettes    Quit date: 1998    Years since quitting: 26.2   Smokeless tobacco: Never  Vaping Use   Vaping Use: Never used  Substance and Sexual Activity   Alcohol use: Yes    Alcohol/week: 2.0 standard drinks of alcohol    Types: 2 Standard drinks or equivalent per week   Drug use: Never   Sexual activity: Yes    Partners: Male    Birth control/protection: Other-see comments    Comment: partner s/p vasectomy  Other Topics Concern   Not on file  Social History Narrative   Lives with her husband and their two sons, Mosetta PuttGrady and Jamison OkaJohn Harper.   Social Determinants of Health   Financial Resource Strain: Not on file  Food Insecurity: Not on file  Transportation Needs: Not on file  Physical Activity: Not on file  Stress: Not on file  Social Connections: Not on file   The following portions of the patient's history were reviewed and updated as appropriate: allergies, current medications, past family history, past medical history, past social history, past surgical history and problem list.   Health Maintenance:     Component Value Date/Time   DIAGPAP  03/24/2020 1048    - Negative for intraepithelial lesion or malignancy (NILM)   HPVHIGH Negative 03/24/2020 1048   ADEQPAP  03/24/2020 1048    Satisfactory for evaluation; transformation zone component PRESENT.   Review of Systems:  Negative except noted in HPI  Objective:  Physical Exam BP 124/71   Pulse 75   Resp 16   Ht 5' 4.5" (1.638 m)   Wt 147 lb (66.7 kg)   LMP 01/13/2023   BMI 24.84 kg/m  CONSTITUTIONAL: Well-developed, well-nourished female in no acute distress.  HENT:  Normocephalic, atraumatic EYES: Conjunctivae and EOM are normal NECK: Normal range of motion SKIN: Skin is warm and dry NEUROLOGIC: Alert and oriented to person, place, and time PSYCHIATRIC: Normal mood and affect CARDIOVASCULAR: Normal heart rate noted RESPIRATORY: Effort and rate normal ABDOMEN: Soft, NT, no distention   PELVIC: Normal appearing external genitalia. Normal uterine size, no other palpable masses, no uterine or adnexal tenderness. ? Hemorrhoid vs skin tag at anus, mild erythema MUSCULOSKELETAL: Normal range of motion  Labs and Imaging No results found for this or any previous visit (from the past 24 hour(s)).  Assessment & Plan:   1. Pelvic pain   2. Skin yeast infection    Pelvic US Rx nystatin powder Follow up prn   I spent 15 minutes dedicated to the care of this patient including pre-visit review of records, face to face time with the patient discussing her conditions and treatments and post visit ordering of testing.  Donette Larry, CNM 02/02/2023 11:51 AM

## 2023-02-02 NOTE — Addendum Note (Signed)
Addended by: Donette Larry E on: 02/02/2023 11:53 AM   Modules accepted: Orders

## 2023-02-06 ENCOUNTER — Encounter: Payer: Self-pay | Admitting: Certified Nurse Midwife

## 2023-02-06 ENCOUNTER — Ambulatory Visit (INDEPENDENT_AMBULATORY_CARE_PROVIDER_SITE_OTHER): Payer: BC Managed Care – PPO

## 2023-02-06 ENCOUNTER — Telehealth: Payer: Self-pay | Admitting: Certified Nurse Midwife

## 2023-02-06 DIAGNOSIS — R102 Pelvic and perineal pain: Secondary | ICD-10-CM

## 2023-02-06 DIAGNOSIS — D251 Intramural leiomyoma of uterus: Secondary | ICD-10-CM | POA: Diagnosis not present

## 2023-02-06 NOTE — Telephone Encounter (Signed)
Left message to call back  

## 2023-02-07 ENCOUNTER — Ambulatory Visit (INDEPENDENT_AMBULATORY_CARE_PROVIDER_SITE_OTHER): Payer: BC Managed Care – PPO | Admitting: Obstetrics and Gynecology

## 2023-02-07 ENCOUNTER — Encounter: Payer: Self-pay | Admitting: Obstetrics and Gynecology

## 2023-02-07 VITALS — BP 116/60 | HR 79 | Ht 64.5 in | Wt 149.0 lb

## 2023-02-07 DIAGNOSIS — R9389 Abnormal findings on diagnostic imaging of other specified body structures: Secondary | ICD-10-CM | POA: Diagnosis not present

## 2023-02-07 DIAGNOSIS — R232 Flushing: Secondary | ICD-10-CM | POA: Diagnosis not present

## 2023-02-07 DIAGNOSIS — N951 Menopausal and female climacteric states: Secondary | ICD-10-CM

## 2023-02-07 MED ORDER — NORETHINDRONE-ETH ESTRADIOL 1-5 MG-MCG PO TABS: 1.0000 | ORAL_TABLET | Freq: Every day | ORAL | 3 refills | Status: DC

## 2023-02-07 NOTE — Progress Notes (Signed)
   RETURN GYNECOLOGY VISIT  Subjective:  [name] is a 47 y.o. G2P2 with LMP 01/13/23 and history of endometrial ablation (09/2022) presenting for [place] follow up  Saw Donette Larry CNM 5 days ago. Reported 1 month of intermittent sharp pain in R lower pelvic area. No urinary symptoms or vaginal discharge. Minimal vaginal bleeding.  Pelvic US 4/10 notable for 78mm hypoechoic lesion within the endometrial cavity. No adnexal masses.   I personally reviewed her pre-ablation path from 10/04/22 - benign with mildly disordered proliferative endometrium, no hyperplasia, EIN, or malignancy.  Today, she does not have concerns about pain. She is interested in hormone therapy for hot flashes.  Partner had vasectomy.  Objective:   Vitals:   02/07/23 1332  BP: 116/60  Pulse: 79  Weight: 149 lb (67.6 kg)  Height: 5' 4.5" (1.638 m)    General:  Alert, oriented and cooperative. Patient is in no acute distress.  Skin: Skin is warm and dry. No rash noted.   Cardiovascular: Normal heart rate noted  Respiratory: Normal respiratory effort, no problems with respiration noted    Assessment and Plan:  [name] is a 47 y.o. s/p endometrial ablation 09/2022 with hypoechoic lesion on ultrasound likely due to scarring from ablation and hot flashes related to menopausal transition/perimenopause.   Abnormal finding on ultrasound Likely scarring due to ablation. Low concern for malignancy given pt's normal bleeding pattern and normal pathology in December Discussed follow up imaging prn  Hot flashes Perimenopause -- We discussed the following: - Hormone therapy is the first line treatment for hot flashes/vasomotor symptoms related to menopause. It may also help with sleep, joint pain, mood, prevention of bone loss, diabetes, dementia & cardiovascular disease, and even lower all-cause mortality for women younger than 19. - We use the lowest effective dose to manage symptoms for the  shortest amount of time needed - Hormone therapy appears to be most beneficial and least risky for women age 45-59 who are less than 10 years from menopause. Women starting HT at 60-65+ or >10 years from menopause carry the highest risk of cardiovascular disease, VTE & stroke.  - Because the patient has a uterus, we discussed the need for estrogen WITH progesterone to mitigate the risk of endometrial hyperplasia/cancer - Discussed main risk of combined hormone therapy in young women (<60yo) appears to be VTE but in older women there appears to be elevated risk of breast cancer, coronary event, VTE, stroke, gallbladder disease and urinary incontinence related to duration of therapy.  -- After discussion, she would like to trial PO hormone therapy - pt's partner had a vasectomy, but reviewed this is non-contraceptive dosing of a pill with hormones similar to COC -     norethindrone-ethinyl estradiol (FEMHRT 1/5) 1-5 MG-MCG TABS tablet; Take 1 tablet by mouth daily.  Return in about 6 months (around 08/09/2023) for annual exam.  Lennart Pall, MD

## 2023-05-02 ENCOUNTER — Other Ambulatory Visit: Payer: Self-pay | Admitting: *Deleted

## 2023-05-02 DIAGNOSIS — R9389 Abnormal findings on diagnostic imaging of other specified body structures: Secondary | ICD-10-CM

## 2023-05-02 NOTE — Progress Notes (Signed)
Pt called requesting a follow up order from her pelvic U/S she had in April.  Per Radiology a sonohysterography was recommended.

## 2023-05-07 ENCOUNTER — Other Ambulatory Visit: Payer: Self-pay | Admitting: Plastic Surgery

## 2023-05-07 DIAGNOSIS — Z1231 Encounter for screening mammogram for malignant neoplasm of breast: Secondary | ICD-10-CM

## 2023-05-14 DIAGNOSIS — Z01812 Encounter for preprocedural laboratory examination: Secondary | ICD-10-CM | POA: Diagnosis not present

## 2023-05-14 DIAGNOSIS — K439 Ventral hernia without obstruction or gangrene: Secondary | ICD-10-CM | POA: Diagnosis not present

## 2023-05-22 ENCOUNTER — Ambulatory Visit (INDEPENDENT_AMBULATORY_CARE_PROVIDER_SITE_OTHER): Payer: BC Managed Care – PPO | Admitting: Obstetrics and Gynecology

## 2023-05-22 ENCOUNTER — Encounter: Payer: Self-pay | Admitting: Obstetrics and Gynecology

## 2023-05-22 ENCOUNTER — Other Ambulatory Visit (HOSPITAL_COMMUNITY)
Admission: RE | Admit: 2023-05-22 | Discharge: 2023-05-22 | Disposition: A | Discharge status: Home / Self Care | Payer: BC Managed Care – PPO | Source: Ambulatory Visit | Attending: Obstetrics and Gynecology | Admitting: Obstetrics and Gynecology

## 2023-05-22 VITALS — BP 148/80 | HR 65 | Ht 64.5 in | Wt 150.0 lb

## 2023-05-22 DIAGNOSIS — Z1211 Encounter for screening for malignant neoplasm of colon: Secondary | ICD-10-CM

## 2023-05-22 DIAGNOSIS — Z01419 Encounter for gynecological examination (general) (routine) without abnormal findings: Secondary | ICD-10-CM | POA: Diagnosis not present

## 2023-05-22 NOTE — Progress Notes (Signed)
ANNUAL EXAM Patient name: [name] MRN [id number]  Date of birth: [birth year] Chief Complaint:   Annual Exam  History of Present Illness:   [name] is a 47 y.o. G2P2000  female being seen today for a routine annual exam.  Current complaints: no concerns today, was doing great on HRT, had to stop because she is having surgery for hernia and a breast lift. Will restart after surgery.   No LMP recorded.   The pregnancy intention screening data noted above was reviewed. Potential methods of contraception were discussed. The patient elected to proceed with No data recorded.   Last pap 03/24/20. Results were: NILM w/ HRHPV negative. H/O abnormal pap: no Last mammogram: 06/16/22. Results were: normal. Family h/o breast cancer: no Last colonoscopy: n/a. Results were: N/A. Family h/o colorectal cancer: no     04/13/2020    2:09 PM 12/22/2015    9:26 AM 12/17/2015   10:39 AM 11/29/2015    9:41 AM 09/05/2015   11:56 AM  Depression screen PHQ 2/9  Decreased Interest 0 0 0 0 0  Down, Depressed, Hopeless 0 0 0 0 0  PHQ - 2 Score 0 0 0 0 0  Altered sleeping 0      Tired, decreased energy 0      Change in appetite 0      Feeling bad or failure about yourself  0      Trouble concentrating 0      Moving slowly or fidgety/restless 0      Suicidal thoughts 0      PHQ-9 Score 0            04/13/2020    2:09 PM  GAD 7 : Generalized Anxiety Score  Nervous, Anxious, on Edge 0  Control/stop worrying 0  Worry too much - different things 0  Trouble relaxing 0  Restless 0  Easily annoyed or irritable 0  Afraid - awful might happen 0  Total GAD 7 Score 0     Review of Systems:   Pertinent items are noted in HPI Denies any headaches, blurred vision, fatigue, shortness of breath, chest pain, abdominal pain, abnormal vaginal discharge/itching/odor/irritation, problems with periods, bowel movements, urination, or intercourse unless otherwise stated above. Pertinent History  Reviewed:  Reviewed past medical,surgical, social and family history.  Reviewed problem list, medications and allergies. Physical Assessment:   Vitals:   05/22/23 1457  BP: (!) 148/80  Pulse: 65  Weight: 150 lb (68 kg)  Height: 5' 4.5" (1.638 m)  Body mass index is 25.35 kg/m.        Physical Examination:   General appearance - well appearing, and in no distress  Mental status - alert, oriented to person, place, and time  Psych:  She has a normal mood and affect  Skin - warm and dry, normal color, no suspicious lesions noted  Chest - effort normal, all lung fields clear to auscultation bilaterally  Heart - normal rate and regular rhythm  Neck:  midline trachea, no thyromegaly or nodules  Abdomen - soft, nontender, nondistended, no masses or organomegaly  Pelvic - VULVA: normal appearing vulva with no masses, tenderness or lesions  VAGINA: normal appearing vagina with normal color and discharge, no lesions  CERVIX: normal appearing cervix without discharge or lesions, no CMT  Thin prep pap is done with HR HPV cotesting    Extremities:  No swelling or varicosities noted  Chaperone present for exam  No results found for this  or any previous visit (from the past 24 hour(s)).  Assessment & Plan:  1. Well woman exam Had abnormal finding on ultrasound, d/w Dr. Berton Lan. Will plan on scheduling sonohysterogram  - Cytology - PAP( [place])  2. Encounter for screening colonoscopy  - Ambulatory referral to Gastroenterology   Labs/procedures today:   Mammogram:  scheduled for 7/30 , or sooner if problems Colonoscopy: schedule screening colonoscopy as soon as possible, or sooner if problems  No orders of the defined types were placed in this encounter.   Meds: No orders of the defined types were placed in this encounter.   Follow-up: Return in one year for annual well woman  Albertine Grates, FNP

## 2023-05-24 LAB — CYTOLOGY - PAP
Adequacy: ABSENT
Diagnosis: NEGATIVE
High risk HPV: NEGATIVE

## 2023-05-28 ENCOUNTER — Encounter: Payer: Self-pay | Admitting: Internal Medicine

## 2023-05-28 ENCOUNTER — Telehealth: Payer: Self-pay | Admitting: Internal Medicine

## 2023-05-28 NOTE — Telephone Encounter (Signed)
Error

## 2023-05-29 ENCOUNTER — Ambulatory Visit
Admission: RE | Admit: 2023-05-29 | Discharge: 2023-05-29 | Disposition: A | Discharge status: Home / Self Care | Payer: BC Managed Care – PPO | Source: Ambulatory Visit | Attending: Plastic Surgery | Admitting: Plastic Surgery

## 2023-05-29 DIAGNOSIS — Z1231 Encounter for screening mammogram for malignant neoplasm of breast: Secondary | ICD-10-CM

## 2023-06-05 DIAGNOSIS — Z411 Encounter for cosmetic surgery: Secondary | ICD-10-CM | POA: Diagnosis not present

## 2023-06-05 HISTORY — PX: COMBINED AUGMENTATION MAMMAPLASTY AND ABDOMINOPLASTY: SUR291

## 2023-06-05 HISTORY — PX: AUGMENTATION MAMMAPLASTY: SUR837

## 2023-06-20 ENCOUNTER — Ambulatory Visit (AMBULATORY_SURGERY_CENTER): Payer: BC Managed Care – PPO

## 2023-06-20 ENCOUNTER — Other Ambulatory Visit: Payer: Self-pay

## 2023-06-20 VITALS — Ht 64.0 in | Wt 145.0 lb

## 2023-06-20 DIAGNOSIS — Z1211 Encounter for screening for malignant neoplasm of colon: Secondary | ICD-10-CM

## 2023-06-20 MED ORDER — NA SULFATE-K SULFATE-MG SULF 17.5-3.13-1.6 GM/177ML PO SOLN: 1.0000 | Freq: Once | ORAL | 0 refills | Status: AC

## 2023-06-20 NOTE — Progress Notes (Signed)
Denies allergies to eggs or soy products. Denies complication of anesthesia or sedation. Denies use of weight loss medication. Denies use of O2.   Emmi instructions given for colonoscopy.  

## 2023-07-11 ENCOUNTER — Encounter: Payer: BC Managed Care – PPO | Admitting: Internal Medicine

## 2023-08-03 DIAGNOSIS — Z23 Encounter for immunization: Secondary | ICD-10-CM | POA: Diagnosis not present

## 2023-09-25 DIAGNOSIS — J019 Acute sinusitis, unspecified: Secondary | ICD-10-CM | POA: Diagnosis not present

## 2023-11-14 ENCOUNTER — Other Ambulatory Visit: Payer: Self-pay | Admitting: Obstetrics and Gynecology

## 2023-11-14 ENCOUNTER — Ambulatory Visit (HOSPITAL_BASED_OUTPATIENT_CLINIC_OR_DEPARTMENT_OTHER): Payer: BC Managed Care – PPO | Admitting: Obstetrics & Gynecology

## 2023-11-14 ENCOUNTER — Ambulatory Visit (HOSPITAL_BASED_OUTPATIENT_CLINIC_OR_DEPARTMENT_OTHER): Payer: BC Managed Care – PPO

## 2023-11-14 VITALS — BP 145/70 | HR 76 | Ht 64.0 in | Wt 159.2 lb

## 2023-11-14 DIAGNOSIS — R9389 Abnormal findings on diagnostic imaging of other specified body structures: Secondary | ICD-10-CM

## 2023-11-14 DIAGNOSIS — Z9889 Other specified postprocedural states: Secondary | ICD-10-CM | POA: Diagnosis not present

## 2023-11-14 DIAGNOSIS — N83202 Unspecified ovarian cyst, left side: Secondary | ICD-10-CM | POA: Diagnosis not present

## 2023-11-14 DIAGNOSIS — N912 Amenorrhea, unspecified: Secondary | ICD-10-CM | POA: Diagnosis not present

## 2023-11-14 DIAGNOSIS — R935 Abnormal findings on diagnostic imaging of other abdominal regions, including retroperitoneum: Secondary | ICD-10-CM | POA: Diagnosis not present

## 2023-11-17 NOTE — Progress Notes (Signed)
GYNECOLOGY  VISIT  CC:   possible sonohysterogram  HPI: 47 y.o. G1P2000 Married White or Caucasian female here for possible sonohysterogram due to possible endometrial lesion noted on ultrasound 02/06/2023.  Ultrasound was done then due to abdominal pain which has resolved.  She has hx of endometrial ablation 09/2022.  Never had bleeding after the endometrial ablation.  Pathology done prior to ablation showed disordered proliferative endometrium.  This was obtained same day as ablation was done.  Now she is on HRT.    Personally reviewed images from ultrasound 01/2023.  Area within endometrium appears cystic on that ultrasound.  Findings today showed endometrium measuring 3.72mm with no irregular appearance.  There is no finding on u/s today that is similar to the ultrasound from 01/2023.  She does have a 5.5cm left ovarian cyst noted today.  This was not present on prior ultrasound.  Findings reviewed with pt.  Giving findings, we discussed difficulty with entering endometrial cavity with [facility] after ablation especially when there is no bleeding after the procedure.  Possibly the findings were still from post operative healing.  Given normal appearing endometrium today, feel risks of trying to pass catheter into cavity outweigh benefits.  I would like to recheck ovary in 12 weeks and can look at endometrium again then.  If siminlar to today, do not feel need to proceed with sonohysterogram.  Discussed with pt personally.  She is very comfortable with plan.     Past Medical History:  Diagnosis Date   Abnormal uterine bleeding (AUB)    Allergy    Anemia    Anxiety    Arthritis    FEET   Asthma    Chronic kidney disease    Depression    Environmental allergies    GERD (gastroesophageal reflux disease)    Heavy menstrual bleeding    History of pregnancy induced hypertension    Hypertension    Wears contact lenses     MEDS:   Current Outpatient Medications on File Prior to Visit  Medication  Sig Dispense Refill   AMINO ACID SUPER FORMULA PO Take by mouth daily.     ibuprofen (ADVIL) 800 MG tablet Take 800 mg by mouth every 8 (eight) hours as needed.     norethindrone-ethinyl estradiol (FEMHRT 1/5) 1-5 MG-MCG TABS tablet Take 1 tablet by mouth daily. 90 tablet 3   No current facility-administered medications on file prior to visit.    ALLERGIES: Biaxin [clarithromycin]; Antihistamines, diphenhydramine-type; and Augmentin [amoxicillin-pot clavulanate]  SH:  married, non smoker  Review of Systems  Constitutional: Negative.   Genitourinary: Negative.     PHYSICAL EXAMINATION:    BP (!) 145/70 (BP Location: Right Arm, Patient Position: Sitting, Cuff Size: Normal)   Pulse 76   Ht 5\' 4"  (1.626 m)   Wt 159 lb 3.2 oz (72.2 kg)   BMI 27.33 kg/m     Physical Exam Constitutional:      Appearance: Normal appearance.  Neurological:     General: No focal deficit present.  Psychiatric:        Mood and Affect: Mood normal.      Assessment/Plan: 1. Abnormal ultrasound of endometrium (Primary) - endometrium appears normal on ultrasound today with normal vascular flow.  Will plan to recheck 3 months  2. Left ovarian cyst - recheck 3 months  3. S/P endometrial ablation  4. Amenorrhea

## 2023-11-20 ENCOUNTER — Encounter: Payer: Self-pay | Admitting: Obstetrics and Gynecology

## 2024-01-01 ENCOUNTER — Telehealth: Payer: Self-pay | Admitting: *Deleted

## 2024-01-01 NOTE — Telephone Encounter (Signed)
 Left patient a message to call and schedule F/U U/S appointment with Dr. Para March on 02/21/24 or after.

## 2024-02-05 ENCOUNTER — Other Ambulatory Visit (HOSPITAL_BASED_OUTPATIENT_CLINIC_OR_DEPARTMENT_OTHER): Payer: Self-pay | Admitting: *Deleted

## 2024-02-05 DIAGNOSIS — N83202 Unspecified ovarian cyst, left side: Secondary | ICD-10-CM

## 2024-02-05 NOTE — Progress Notes (Signed)
 Chart reviewed and [place] order placed KD

## 2024-02-06 ENCOUNTER — Ambulatory Visit (INDEPENDENT_AMBULATORY_CARE_PROVIDER_SITE_OTHER): Payer: BC Managed Care – PPO | Admitting: Obstetrics & Gynecology

## 2024-02-06 ENCOUNTER — Ambulatory Visit (INDEPENDENT_AMBULATORY_CARE_PROVIDER_SITE_OTHER): Payer: BC Managed Care – PPO

## 2024-02-06 VITALS — BP 142/71 | HR 75 | Ht 64.0 in | Wt 145.0 lb

## 2024-02-06 DIAGNOSIS — N83202 Unspecified ovarian cyst, left side: Secondary | ICD-10-CM

## 2024-02-06 DIAGNOSIS — Z9889 Other specified postprocedural states: Secondary | ICD-10-CM | POA: Diagnosis not present

## 2024-02-08 ENCOUNTER — Encounter (HOSPITAL_BASED_OUTPATIENT_CLINIC_OR_DEPARTMENT_OTHER): Payer: Self-pay | Admitting: Obstetrics & Gynecology

## 2024-02-08 NOTE — Progress Notes (Signed)
 GYNECOLOGY  VISIT  CC:   Discuss ultrasound results, history of 5.5 cm ovarian cyst  HPI: 48 y.o. G2P2000 Married White or Caucasian female here for discussion of ultrasound results.  Ultrasound obtained to recheck ovarian cyst that was noted on her ultrasound.  Today the uterus measures 7.5 x 4.2 x 3.8 cm.  The endometrium is 3.6 mm and appears symmetric and thin.  Both ovaries are normal.  Resolution of the prior cyst was noted today.  Images reviewed with patient.  Patient was referred to do it abnormal.  Atrium but we have now done 2 ultrasounds with normal-appearing endometrium.  She has history of ablation and has not been bleeding since the surgery.   She is very comfortable with no additional imaging.    Past Medical History:  Diagnosis Date   Abnormal uterine bleeding (AUB)    Allergy    Anemia    Anxiety    Arthritis    FEET   Asthma    Chronic kidney disease    Depression    Environmental allergies    GERD (gastroesophageal reflux disease)    Heavy menstrual bleeding    History of pregnancy induced hypertension    Hypertension    Wears contact lenses     MEDS:   Current Outpatient Medications on File Prior to Visit  Medication Sig Dispense Refill   AMINO ACID SUPER FORMULA PO Take by mouth daily.     ibuprofen (ADVIL) 800 MG tablet Take 800 mg by mouth every 8 (eight) hours as needed.     norethindrone-ethinyl estradiol (FEMHRT 1/5) 1-5 MG-MCG TABS tablet Take 1 tablet by mouth daily. 90 tablet 3   No current facility-administered medications on file prior to visit.    ALLERGIES: Biaxin [clarithromycin]; Antihistamines, diphenhydramine-type; and Augmentin [amoxicillin-pot clavulanate]  SH: Married non-smoker  Review of Systems  Constitutional: Negative.   Genitourinary: Negative.     PHYSICAL EXAMINATION:    BP (!) 142/71 (BP Location: Right Arm, Patient Position: Sitting, Cuff Size: Normal)   Pulse 75   Ht 5\' 4"  (1.626 m)   Wt 145 lb (65.8 kg)   BMI  24.89 kg/m      Physical Exam Constitutional:      Appearance: Normal appearance.  Neurological:     General: No focal deficit present.     Mental Status: She is alert.  Psychiatric:        Mood and Affect: Mood normal.    Assessment/Plan: 1. Cyst of left ovary (Primary) - Ultrasound today shows resolution of the cyst.  Patient will return to her OB/GYN for routine care.  2. History of endometrial ablation

## 2024-03-22 ENCOUNTER — Other Ambulatory Visit: Payer: Self-pay | Admitting: Obstetrics and Gynecology

## 2024-03-29 DIAGNOSIS — J0191 Acute recurrent sinusitis, unspecified: Secondary | ICD-10-CM | POA: Diagnosis not present

## 2024-04-18 DIAGNOSIS — Z8709 Personal history of other diseases of the respiratory system: Secondary | ICD-10-CM | POA: Diagnosis not present

## 2024-04-18 DIAGNOSIS — R052 Subacute cough: Secondary | ICD-10-CM | POA: Diagnosis not present

## 2024-05-12 ENCOUNTER — Other Ambulatory Visit: Payer: Self-pay | Admitting: Obstetrics & Gynecology

## 2024-05-12 DIAGNOSIS — Z1231 Encounter for screening mammogram for malignant neoplasm of breast: Secondary | ICD-10-CM

## 2024-05-30 ENCOUNTER — Ambulatory Visit
Admission: RE | Admit: 2024-05-30 | Discharge: 2024-05-30 | Disposition: A | Discharge status: Home / Self Care | Source: Ambulatory Visit | Attending: Obstetrics & Gynecology | Admitting: Obstetrics & Gynecology

## 2024-05-30 DIAGNOSIS — Z1231 Encounter for screening mammogram for malignant neoplasm of breast: Secondary | ICD-10-CM | POA: Diagnosis not present

## 2024-06-02 ENCOUNTER — Other Ambulatory Visit (HOSPITAL_COMMUNITY)
Admission: RE | Admit: 2024-06-02 | Discharge: 2024-06-02 | Disposition: A | Discharge status: Home / Self Care | Source: Ambulatory Visit | Attending: Obstetrics & Gynecology | Admitting: Obstetrics & Gynecology

## 2024-06-02 ENCOUNTER — Ambulatory Visit (HOSPITAL_BASED_OUTPATIENT_CLINIC_OR_DEPARTMENT_OTHER): Admitting: Obstetrics & Gynecology

## 2024-06-02 ENCOUNTER — Encounter (HOSPITAL_BASED_OUTPATIENT_CLINIC_OR_DEPARTMENT_OTHER): Payer: Self-pay | Admitting: Obstetrics & Gynecology

## 2024-06-02 VITALS — BP 134/94 | HR 72 | Ht 64.0 in | Wt 154.0 lb

## 2024-06-02 DIAGNOSIS — L900 Lichen sclerosus et atrophicus: Secondary | ICD-10-CM

## 2024-06-02 DIAGNOSIS — R6882 Decreased libido: Secondary | ICD-10-CM | POA: Diagnosis not present

## 2024-06-02 DIAGNOSIS — Z01419 Encounter for gynecological examination (general) (routine) without abnormal findings: Secondary | ICD-10-CM | POA: Diagnosis not present

## 2024-06-02 DIAGNOSIS — R5383 Other fatigue: Secondary | ICD-10-CM | POA: Diagnosis not present

## 2024-06-02 DIAGNOSIS — Z9889 Other specified postprocedural states: Secondary | ICD-10-CM | POA: Diagnosis not present

## 2024-06-02 DIAGNOSIS — Z124 Encounter for screening for malignant neoplasm of cervix: Secondary | ICD-10-CM | POA: Insufficient documentation

## 2024-06-02 DIAGNOSIS — R4189 Other symptoms and signs involving cognitive functions and awareness: Secondary | ICD-10-CM | POA: Diagnosis not present

## 2024-06-02 MED ORDER — CLOBETASOL PROPIONATE 0.05 % EX OINT
1.0000 | TOPICAL_OINTMENT | Freq: Two times a day (BID) | CUTANEOUS | 1 refills | Status: AC
Start: 2024-06-02 — End: ?

## 2024-06-02 NOTE — Progress Notes (Signed)
 ANNUAL EXAM Patient name: [name] MRN [id number]  Date of birth: [birth year] Chief Complaint:   Gynecologic Exam (Requesting PAP; test hormones; lichen sclerosa; rectal growth)  History of Present Illness:   [name] is a 48 y.o. G51P2000 Caucasian female being seen today for a routine annual exam.   H/o ablation.  Denies vaginal bleeding.  Is on HRT but not sure this is helping a lot.  She is feeling decreased libido, brain fog, low energy.    Had breast lift with implants placed 05/2023.  Also had revision of abdominoplasty.  This was very tight and she still feels a lot of tightness and that is difficult to be straight.  She has been having regular massage of her hip.  Discussed pelvic pt to see if can help with some release from the surgery.    Father passed away about 8 weeks.  She had to be in [place]  to help with decision making.    No LMP recorded. Patient has had an ablation.  Last pap 05/22/2023. Results were: ASCUS with neg HR HPV. H/O abnormal pap: yes in her 41's Last mammogram: 05/30/2024. Results were: pending.  Family h/o breast cancer: yes :  Paternal GM in her 77's.   Last colonoscopy: Not Done. Has been referred for this.       06/02/2024    2:10 PM 04/13/2020    2:09 PM 12/22/2015    9:26 AM 12/17/2015   10:39 AM 11/29/2015    9:41 AM  Depression screen PHQ 2/9  Decreased Interest 0 0 0 0 0  Down, Depressed, Hopeless 0 0 0 0 0  PHQ - 2 Score 0 0 0 0 0  Altered sleeping  0     Tired, decreased energy  0     Change in appetite  0     Feeling bad or failure about yourself   0     Trouble concentrating  0     Moving slowly or fidgety/restless  0     Suicidal thoughts  0     PHQ-9 Score  0       Review of Systems:   Pertinent items are noted in HPI Denies any vaginal discharge, urinary or bowel issues Pertinent History Reviewed:  Reviewed past medical,surgical, social and family history.  Reviewed problem list, medications and  allergies. Physical Assessment:   Vitals:   06/02/24 1337  BP: (!) 134/94  Pulse: 72  SpO2: 100%  Weight: 154 lb (69.9 kg)  Height: 5' 4 (1.626 m)  Body mass index is 26.43 kg/m.        Physical Examination:   General appearance - well appearing, and in no distress  Mental status - alert, oriented to person, place, and time  Psych:  She has a normal mood and affect  Skin - warm and dry, normal color, no suspicious lesions noted  Chest - effort normal, all lung fields clear to auscultation bilaterally  Heart - normal rate and regular rhythm  Neck:  midline trachea, no thyromegaly or nodules  Breasts - breasts appear normal, no suspicious masses, no skin or nipple changes or  axillary nodes  Abdomen - soft, nontender, nondistended, no masses or organomegaly  Pelvic - VULVA: normal appearing vulva except for hypopigmentation in the midline with thickening, no ulcerations  VAGINA: normal appearing vagina with normal color and discharge, no lesions   CERVIX: normal appearing cervix without discharge or lesions, no CMT  Thin prep pap is  updated today  UTERUS: uterus is felt to be normal size, shape, consistency and nontender   ADNEXA: No adnexal masses or tenderness noted.  Rectal - normal rectal, good sphincter tone,prolapsed polyp vs hemorrhoid present, non ulcerated  Extremities:  No swelling or varicosities noted  Chaperone present for exam  No results found for this or any previous visit (from the past 24 hours).  Assessment & Plan:  1. Well woman exam with routine gynecological exam (Primary) - Pap smear updated today - Mammogram just completed.  Hasn't been read yet.   - Colonoscopy referral has already been made for her - lab work ordered as below - vaccines reviewed/updated  2. Cervical cancer screening - Cytology - PAP( [place])  3. History of abdominoplasty - Ambulatory referral to Physical Therapy  4. Decreased libido - Estrogens , Total - Progesterone  -  Testosterone , Total, LC/MS/MS - Follicle stimulating hormone  5. Low energy - Estrogens , Total - Progesterone  - Testosterone , Total, LC/MS/MS - Follicle stimulating hormone - VITAMIN D  25 Hydroxy (Vit-D Deficiency, Fractures) - B12  6. Brain fog - Estrogens , Total - Progesterone  - Testosterone , Total, LC/MS/MS - Follicle stimulating hormone  7. S/P endometrial ablation  8. Lichen sclerosus - discussed topical products that are irritants as well as treatment.  Has not been biopsy proven.  Reasons for biopsy discussed. - clobetasol  ointment (TEMOVATE ) 0.05 %; Apply 1 Application topically 2 (two) times daily. Apply as directed twice daily for up to two weeks.  With flare, apply topically twice daily for up to 5 days.  Dispense: 60 g; Refill: 1   Meds: No orders of the defined types were placed in this encounter.   Follow-up: Return in about 1 year (around 06/02/2025).  Ronal GORMAN Pinal, MD 06/02/2024 2:21 PM

## 2024-06-03 LAB — FOLLICLE STIMULATING HORMONE: FSH: 11.6 m[IU]/mL

## 2024-06-03 LAB — PROGESTERONE: Progesterone: 0.1 ng/mL

## 2024-06-03 LAB — VITAMIN B12: Vitamin B-12: 1355 pg/mL — ABNORMAL HIGH (ref 232–1245)

## 2024-06-03 LAB — ESTROGENS, TOTAL: Estrogen: 256 pg/mL

## 2024-06-03 LAB — TESTOSTERONE, TOTAL, LC/MS/MS: Testosterone, total: 8.2 ng/dL

## 2024-06-03 LAB — VITAMIN D 25 HYDROXY (VIT D DEFICIENCY, FRACTURES): Vit D, 25-Hydroxy: 66.1 ng/mL (ref 30.0–100.0)

## 2024-06-04 ENCOUNTER — Ambulatory Visit (HOSPITAL_BASED_OUTPATIENT_CLINIC_OR_DEPARTMENT_OTHER): Payer: Self-pay | Admitting: Obstetrics & Gynecology

## 2024-06-06 ENCOUNTER — Other Ambulatory Visit (HOSPITAL_BASED_OUTPATIENT_CLINIC_OR_DEPARTMENT_OTHER): Payer: Self-pay | Admitting: Obstetrics & Gynecology

## 2024-06-06 DIAGNOSIS — N951 Menopausal and female climacteric states: Secondary | ICD-10-CM

## 2024-06-06 LAB — CYTOLOGY - PAP
Adequacy: ABSENT
Diagnosis: NEGATIVE

## 2024-06-06 MED ORDER — PROGESTERONE 200 MG PO CAPS: ORAL_CAPSULE | ORAL | 1 refills | Status: DC

## 2024-06-06 MED ORDER — EST ESTROGENS-METHYLTEST 0.625-1.25 MG PO TABS: 1.0000 | ORAL_TABLET | Freq: Every day | ORAL | 5 refills | Status: AC

## 2024-06-20 ENCOUNTER — Telehealth (HOSPITAL_BASED_OUTPATIENT_CLINIC_OR_DEPARTMENT_OTHER): Payer: Self-pay

## 2024-06-20 NOTE — Telephone Encounter (Signed)
 Kristine Arpilleda with [employer] emailed requesting medical records for the patient from the past five years in reference to Dr. Cleatus. I provided her with the [facility] clinic fax number.

## 2024-06-27 ENCOUNTER — Ambulatory Visit: Attending: Obstetrics & Gynecology | Admitting: Physical Therapy

## 2024-06-27 ENCOUNTER — Other Ambulatory Visit: Payer: Self-pay

## 2024-06-27 ENCOUNTER — Encounter: Payer: Self-pay | Admitting: Physical Therapy

## 2024-06-27 DIAGNOSIS — M25551 Pain in right hip: Secondary | ICD-10-CM | POA: Insufficient documentation

## 2024-06-27 DIAGNOSIS — R252 Cramp and spasm: Secondary | ICD-10-CM | POA: Insufficient documentation

## 2024-06-27 DIAGNOSIS — M6281 Muscle weakness (generalized): Secondary | ICD-10-CM | POA: Diagnosis not present

## 2024-06-27 DIAGNOSIS — Z9889 Other specified postprocedural states: Secondary | ICD-10-CM | POA: Insufficient documentation

## 2024-06-27 NOTE — Therapy (Signed)
 OUTPATIENT PHYSICAL THERAPY THORACOLUMBAR EVALUATION   Patient Name: [name] MRN: [id number] DOB:[birth year], 48 y.o., female Today's Date: 06/27/2024  END OF SESSION:  PT End of Session - 06/27/24 1258     Visit Number 1    Date for PT Re-Evaluation 08/22/24    Authorization Type BC[employer]    PT Start Time 1020    PT Stop Time 1109    PT Time Calculation (min) 49 min    Activity Tolerance Patient tolerated treatment well    Behavior During Therapy WFL for tasks assessed/performed          Past Medical History:  Diagnosis Date   Abnormal uterine bleeding (AUB)    Allergy    Anemia    Anxiety    Arthritis    FEET   Asthma    Chronic kidney disease    Depression    Environmental allergies    GERD (gastroesophageal reflux disease)    Heavy menstrual bleeding    History of pregnancy induced hypertension    Hypertension    Wears contact lenses    Past Surgical History:  Procedure Laterality Date   ABDOMINOPLASTY  05/2020   per PT THEN HAD REVISION 04/ 2022   AUGMENTATION MAMMAPLASTY Bilateral 06/05/2023   CESAREAN SECTION     11-30-2005;    @[facility]  by dr darcel  10-28-2007   COMBINED AUGMENTATION MAMMAPLASTY AND ABDOMINOPLASTY  06/05/2023   Also had liposuction   DILITATION & CURRETTAGE/HYSTROSCOPY WITH NOVASURE ABLATION N/A 10/04/2022   Procedure: DILATATION & CURETTAGE/HYSTEROSCOPY WITH NOVASURE ABLATION;  Surgeon: Cleatus Moccasin, MD;  Location: [facility];  Service: Gynecology;  Laterality: N/A;   NASAL SINUS SURGERY  1995   Patient Active Problem List   Diagnosis Date Noted   Lichen sclerosus 06/02/2024   S/P endometrial ablation 06/02/2024   History of abdominoplasty 06/02/2024   History of heavy vaginal bleeding 10/04/2022   Vitamin D  deficiency 05/14/2022    PCP:   REFERRING PROVIDER: Cleotilde Ronal RAMAN, MD  REFERRING DIAG: [phone] (ICD-10-CM) - History of abdominoplasty  Rationale for Evaluation and Treatment:  Rehabilitation  THERAPY DIAG:  Cramp and spasm  Pain in right hip  Muscle weakness (generalized)  ONSET DATE: Aug 2024 revision abdominoplasty with augmentation mammaplasty, initial abdominoplasty in 2021  SUBJECTIVE:                                                                                                                                                                                           SUBJECTIVE STATEMENT: Initial abdomoniplasty in 2021 to repair rectus diastasis/hernia which then failed and needed revision in 2024.  My  Rt hip is a mess - better now than it has been.  I did a ton of pure barre 18mos ago and it got really bad - now doing more walking and hot yoga which helps keep it unlocked.  I sit a lot for work and fly a lot - it will lock back up - the hip will burn on the outside and lock up in the muscle in the front of the pelvis.  I also feel really tight across lower abdomen where they stitched me very tight on the revision.  Gets Rt kneecap pain and feels it is linked to Rt hip.  Especially bothers her coming down stairs.  Epsom salt baths and massage around kneecap have helped.  PERTINENT HISTORY:  Had big babies - 10 and 11lb Gets kneecap pain - especially descending stairs Started HRT about 30 days ago  PAIN:  PAIN:  Are you having pain? Yes NPRS scale: 1-5/10 Pain location: anterior abdomen, Rt hip, Rt kneecap Pain orientation: Right, anterior and across lower abdomen PAIN TYPE: aching, burning, and tight Pain description: intermittent and constant  Aggravating factors: sitting Relieving factors: stretching, moving   PRECAUTIONS: None  RED FLAGS: None   WEIGHT BEARING RESTRICTIONS: No  FALLS:  Has patient fallen in last 6 months? 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  DIAGNOSTIC FINDINGS:    PATIENT SURVEYS:  Modified Oswestry:  MODIFIED OSWESTRY DISABILITY SCALE  Date: 06/27/24 Score  Pain intensity 1 = The pain is bad, but I can manage without having to take (1) I can stand as long as I want but, it increases my pain. pain medication.  2. Personal care (washing, dressing, etc.) 0 =  I can take care of myself normally without causing increased pain.  3. Lifting 0 = I can lift heavy weights without increased pain.  4. Walking 3 =  Pain prevents me from walking more than  mile.  5. Sitting 2 =  Pain prevents me from sitting more than 1 hour.  6. Standing 2 =  Pain prevents me from standing more than 1 hour  7. Sleeping 0 = Pain does not prevent me from sleeping well.  8. Social Life 1 =  My social life is normal, but it increases my level of pain.  9. Traveling 2 =  My pain restricts my travel over 2 hours.  10. Employment/ Homemaking 2 = I can perform most of my homemaking/job duties, but pain prevents me from performing more physically stressful activities (eg, lifting, vacuuming).  Total 13/50, 26%   Interpretation of scores: Score Category Description  0-20% Minimal Disability The patient can cope with most living activities. Usually no treatment is indicated apart from advice on lifting, sitting and exercise  21-40% Moderate Disability The patient experiences more pain and difficulty with sitting, lifting and standing. Travel and social life are more difficult and they may be disabled from work. Personal care, sexual activity and sleeping are not grossly affected, and the patient can usually be managed by  conservative means  41-60% Severe  Disability Pain remains the main problem in this group, but activities of daily living are affected. These patients require a detailed investigation  61-80% Crippled Back pain impinges on all aspects of the patient's life. Positive intervention is required  81-100% Bed-bound  These patients are either bed-bound or exaggerating their symptoms  Bluford FORBES [name] [name] [name], et al. Surgery versus conservative management of stable thoracolumbar fracture: the PRESTO feasibility RCT. [place]): [employer]; 2021 Nov. [facility] Technology Assessment, No. 25.62.) Appendix 3, Oswestry Disability Index category descriptors. Available from: FindJewelers.cz  Minimally Clinically Important Difference (MCID) = 12.8%  COGNITION: Overall cognitive status: Within functional limits for tasks assessed     SENSATION: Will sometimes get numbness on Rt hip  MUSCLE LENGTH: Limited ITB bil  Limited hip flexor and quad on Rt side - very tight and painful when assessing Limited adductors on Rt side  POSTURE: right pelvic obliquity, Rt ilial outflare  PALPATION: Muscle spasms with trigger points present: Rt ITB/lateral quad, iliacus, psoas, gluteals, piriformis, adductors Rt patella limited mobility with crepitus, tender bil distal ITB  LUMBAR ROM:   AROM eval  Flexion Fingers to toes no pain  Extension End range limited with Pulling through abdominal wall  Right lateral flexion Contralateral trunk tightness, pain in Rt hip  Left lateral flexion Contralateral trunk tightness  Right rotation 50% no pain  Left rotation 50% no pain   (Blank rows = not tested)  LOWER EXTREMITY ROM:    Rt hip hypermobile in IR/ER Rt hip Pain into FADIR and FABER Rt anterior hip and thigh pulling and pain with hip extension, limited by pain 50%  LOWER EXTREMITY MMT:   Rt hip abd and ER 4/5, all other muscle groups 5/5 bil LE  LUMBAR SPECIAL TESTS:  + Rt FADIR AND FABER Rt  hip hypermobile for both IR/ER + Ober's test on Rt  FUNCTIONAL TESTS:  Significant pelvic rotation in squat with drop of Rt hemipelvis Increased pronation of Rt foot  GAIT: Distance walked:  Assistive device utilized: None Level of assistance: Complete Independence Comments: no gait deviations  TREATMENT DATE:  06/27/24 Discussed findings and developed POC Discussed DN (has had before but inconsistent - very interested in DN and understands OOP cost) Initiated HEP                                                                                                                                  PATIENT EDUCATION:  Education details: [email or web address] Person educated: Patient Education method: Programmer, multimedia, Facilities manager, Verbal cues, and Handouts Personnel officer) Education comprehension: verbalized understanding and returned demonstration  HOME EXERCISE PROGRAM: Access Code: [email or web address] URL: [email or web address] Date: 06/27/2024 Prepared by: Orvil Onetha Gaffey  Exercises - Seated Lateral Trunk Stretch on Swiss Ball  - 1 x daily - 7 x weekly - 1 sets - 3 reps - 10-30 sec hold - Standing Hip Flexor Stretch With Overhead Reach  and Contralateral Sidebend  - 1 x daily - 7 x weekly - 1 sets - 3 reps - 10-30 sec hold  ASSESSMENT:  CLINICAL IMPRESSION: Patient is a 48 y.o. female who was seen today for physical therapy evaluation and treatment for Rt hip pain and tightness along anterior abdominal wall following revision abdominoplasty in 2024.  Pt had history of  initial rectus diastasis with large central abdominal hernia repair in 2021 but this failed and was revised.  She has ongoing tightness along anterior and lateral trunk and anterior Rt hip.  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  Rt hip pain started with barre classes but has continued since she stopped barre and instead does walking and hot  yoga.  She notes her Rt kneecap has started to hurt as well, mostly with descending stairs. Pt presents with abdominal fascial and muscle tightness, hypermobile Rt hip, and weakness in Rt hip.  She has limited flexibility of Rt hip flexors, quads, ITB and adductors with trigger points present.  She demos pelvic obliquity with dynamic squat.  She will benefit from manual therapy including DN alongside targeted strengthening, functional body mechanics training and stretching to optimize her mobility, reduce pain, and improve her tolerance of work demands.   OBJECTIVE IMPAIRMENTS: decreased knowledge of condition, decreased mobility, decreased ROM, decreased strength, increased fascial restrictions, increased muscle spasms, impaired flexibility, impaired sensation, improper body mechanics, postural dysfunction, and pain.   ACTIVITY LIMITATIONS: sitting, squatting, and stairs  PARTICIPATION LIMITATIONS: driving, community activity, and occupation  PERSONAL FACTORS: Time since onset of injury/illness/exacerbation are also affecting patient's functional outcome.   REHAB POTENTIAL: Excellent  CLINICAL DECISION MAKING: Evolving/moderate complexity  EVALUATION COMPLEXITY: Moderate   GOALS: Goals reviewed with patient? Yes  SHORT TERM GOALS: Target date: 07/25/24  Pt will be ind with initial HEP Baseline: Goal status: INITIAL  2.  Pt will report reduced pain with sitting for work by at least 25% Baseline:  Goal status: INITIAL  3.  Pt will be able to achieve stretching positions for Rt hip flexor and quad without pain Baseline:  Goal status: INITIAL  4.  Pt will be able to demo symmetrical pelvis with 1/2 squat Baseline:  Goal status: INITIAL    LONG TERM GOALS: Target date: 08/22/24  Pt will be ind with advanced HEP Baseline:  Goal status: INITIAL  2.  Pt will be able to demo symmetrical pelvic control with full squat Baseline:  Goal status: INITIAL  3.  Pt will be able to  sit for up to 2 hours for short flights and work calls on Zoom without Rt hip locking up Baseline:  Goal status: INITIAL  4.  Pt will improve ODI to </= 20% to reduce disability level from moderate to min on ODI scale. Baseline: 13/50 = 26% Goal status: INITIAL  5.  Pt will report overall pain not to exceed 3/10 most days of the week. Baseline:  Goal status: INITIAL    PLAN:  PT FREQUENCY: 1-2x/week  PT DURATION: 8 weeks  PLANNED INTERVENTIONS: 97110-Therapeutic exercises, 97530- Therapeutic activity, 97112- Neuromuscular re-education, 97535- Self Care, 02859- Manual therapy, G0283- Electrical stimulation (unattended), [phone]- Electrical stimulation (manual), [id number]- Traction (mechanical), [id number]- Ionotophoresis 4mg /ml Dexamethasone , 79439 (1-2 muscles), 20561 (3+ muscles)- Dry Needling, Patient/Family education, Stair training, Taping, Joint mobilization, Spinal mobilization, Scar mobilization, Cryotherapy, and Moist heat.  PLAN FOR NEXT SESSION: DN Rt hip (anterior,  lateral) and thigh (ITB/lateral quad, adductor, hip flexors), review HEP and progress trunk ROM/stretching, manual therapy for abdominal wall mobility, Rt hip strength for abd/ER/ext, functional body mechanics training working on symmetrical pelvis in squat   Edgardo Petrenko E Redell Nazir, PT 06/27/2024, 12:59 PM

## 2024-06-27 NOTE — Patient Instructions (Signed)

## 2024-07-10 ENCOUNTER — Ambulatory Visit: Attending: Obstetrics & Gynecology | Admitting: Rehabilitative and Restorative Service Providers"

## 2024-07-10 ENCOUNTER — Encounter: Payer: Self-pay | Admitting: Rehabilitative and Restorative Service Providers"

## 2024-07-10 DIAGNOSIS — M25551 Pain in right hip: Secondary | ICD-10-CM | POA: Diagnosis not present

## 2024-07-10 DIAGNOSIS — R252 Cramp and spasm: Secondary | ICD-10-CM | POA: Diagnosis not present

## 2024-07-10 DIAGNOSIS — M6281 Muscle weakness (generalized): Secondary | ICD-10-CM | POA: Diagnosis not present

## 2024-07-10 NOTE — Therapy (Signed)
 OUTPATIENT PHYSICAL THERAPY TREATMENT NOTE   Patient Name: [name] MRN: [id number] DOB:May 24, 1976, 48 y.o., female Today's Date: 07/10/2024  END OF SESSION:  PT End of Session - 07/10/24 0852     Visit Number 2    Date for PT Re-Evaluation 08/22/24    Authorization Type BCBS    PT Start Time [phone]    PT Stop Time 0930    PT Time Calculation (min) 40 min    Activity Tolerance Patient tolerated treatment well    Behavior During Therapy WFL for tasks assessed/performed          Past Medical History:  Diagnosis Date   Abnormal uterine bleeding (AUB)    Allergy    Anemia    Anxiety    Arthritis    FEET   Asthma    Chronic kidney disease    Depression    Environmental allergies    GERD (gastroesophageal reflux disease)    Heavy menstrual bleeding    History of pregnancy induced hypertension    Hypertension    Wears contact lenses    Past Surgical History:  Procedure Laterality Date   ABDOMINOPLASTY  05/2020   per PT THEN HAD REVISION 04/ 2022   AUGMENTATION MAMMAPLASTY Bilateral 06/05/2023   CESAREAN SECTION     11-30-2005;    @[facility]  by dr darcel  10-28-2007   COMBINED AUGMENTATION MAMMAPLASTY AND ABDOMINOPLASTY  06/05/2023   Also had liposuction   DILITATION & CURRETTAGE/HYSTROSCOPY WITH NOVASURE ABLATION N/A 10/04/2022   Procedure: DILATATION & CURETTAGE/HYSTEROSCOPY WITH NOVASURE ABLATION;  Surgeon: Cleatus Moccasin, MD;  Location: [facility];  Service: Gynecology;  Laterality: N/A;   NASAL SINUS SURGERY  1995   Patient Active Problem List   Diagnosis Date Noted   Lichen sclerosus 06/02/2024   S/P endometrial ablation 06/02/2024   History of abdominoplasty 06/02/2024   History of heavy vaginal bleeding 10/04/2022   Vitamin D  deficiency 05/14/2022    PCP:   REFERRING PROVIDER: Cleotilde Ronal RAMAN, MD  REFERRING DIAG: [phone] (ICD-10-CM) - History of abdominoplasty  Rationale for Evaluation and Treatment: Rehabilitation  THERAPY DIAG:   Cramp and spasm  Pain in right hip  Muscle weakness (generalized)  ONSET DATE: Aug 2024 revision abdominoplasty with augmentation mammaplasty, initial abdominoplasty in 2021  SUBJECTIVE:                                                                                                                                                                                           SUBJECTIVE STATEMENT: Patient states that she has put her barre membership on hold and is working on other strengthening and  yoga.  Patient states that she primarily feels the burning down her right leg.  PERTINENT HISTORY:  Initial abdomoniplasty in 2021 to repair rectus diastasis/hernia which then failed and needed revision in 2024.  My Rt hip is a mess - better now than it has been.  I did a ton of pure barre 18mos ago and it got really bad Had big babies - 10 and 11lb Gets kneecap pain - especially descending stairs Started HRT about 30 days ago  PAIN:  PAIN:  Are you having pain? Yes NPRS scale: 3/10 Pain location: anterior abdomen, Rt hip, Rt kneecap Pain orientation: Right, anterior and across lower abdomen PAIN TYPE: aching, burning, and tight Pain description: intermittent and constant  Aggravating factors: sitting Relieving factors: stretching, moving   PRECAUTIONS: None  RED FLAGS: None   WEIGHT BEARING RESTRICTIONS: No  FALLS:  Has patient fallen in last 6 months? 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  DIAGNOSTIC FINDINGS:    PATIENT SURVEYS:  Modified Oswestry:  MODIFIED OSWESTRY DISABILITY SCALE  Date: 06/27/24 Score  Pain intensity 1 = The pain is bad, but I can manage without having to take (1) I can stand as long as I want but, it increases my pain. pain medication.  2. Personal care (washing, dressing, etc.) 0 =  I can take care of myself normally without causing increased pain.  3. Lifting 0 = I can lift heavy weights without increased pain.  4. Walking 3 =  Pain prevents me from walking more than  mile.  5. Sitting 2 =  Pain prevents me from sitting more than 1 hour.  6. Standing 2 =  Pain prevents me from standing more than 1 hour  7. Sleeping 0 = Pain does not prevent me from sleeping well.  8. Social Life 1 =  My social life is normal, but it increases my level of pain.  9. Traveling 2 =  My pain restricts my travel over 2 hours.  10. Employment/ Homemaking 2 = I can perform most of my homemaking/job duties, but pain prevents me from performing more physically stressful activities (eg, lifting, vacuuming).  Total 13/50, 26%   Interpretation of scores: Score Category Description  0-20% Minimal Disability The patient can cope with most living activities. Usually no treatment is indicated apart from advice on lifting, sitting and exercise  21-40% Moderate Disability The patient experiences more pain and difficulty with sitting, lifting and standing. Travel and social life are more difficult and they may be disabled from work. Personal care, sexual activity and sleeping are not grossly affected, and the patient can usually be managed by conservative means  41-60% Severe Disability Pain remains the main problem in this group, but activities of daily living are affected. These patients require a detailed investigation  61-80% Crippled Back pain impinges on all aspects of the patient's life. Positive intervention is required  81-100% Bed-bound  These patients are either  bed-bound or exaggerating their symptoms  Bluford BRAVO, [name] [name] [name], et  al. Surgery versus conservative management of stable thoracolumbar fracture: the PRESTO feasibility RCT. [place] ([place]): [employer]; 2021 Nov. [facility] Technology Assessment, No. 25.62.) Appendix 3, Oswestry Disability Index category descriptors. Available from: FindJewel[employer].cz  Minimally Clinically Important Difference (MCID) = 12.8%  COGNITION: Overall cognitive status: Within functional limits for tasks assessed     SENSATION: Will sometimes get numbness on Rt hip  MUSCLE LENGTH: Limited ITB bil  Limited hip flexor and quad on Rt side - very tight and painful when assessing Limited adductors on Rt side  POSTURE: right pelvic obliquity, Rt ilial outflare  PALPATION: Muscle spasms with trigger points present: Rt ITB/lateral quad, iliacus, psoas, gluteals, piriformis, adductors Rt patella limited mobility with crepitus, tender bil distal ITB  LUMBAR ROM:   AROM eval  Flexion Fingers to toes no pain  Extension End range limited with Pulling through abdominal wall  Right lateral flexion Contralateral trunk tightness, pain in Rt hip  Left lateral flexion Contralateral trunk tightness  Right rotation 50% no pain  Left rotation 50% no pain   (Blank rows = not tested)  LOWER EXTREMITY ROM:    Rt hip hypermobile in IR/ER Rt hip Pain into FADIR and FABER Rt anterior hip and thigh pulling and pain with hip extension, limited by pain 50%  LOWER EXTREMITY MMT:   Rt hip abd and ER 4/5, all other muscle groups 5/5 bil LE  LUMBAR SPECIAL TESTS:  + Rt FADIR AND FABER Rt hip hypermobile for both IR/ER + Ober's test on Rt  FUNCTIONAL TESTS:  Significant pelvic rotation in squat with drop of Rt hemipelvis Increased pronation of Rt foot  GAIT: Distance walked:  Assistive device utilized: None Level of assistance: Complete Independence Comments: no gait  deviations  TREATMENT DATE:  07/10/2024: Nustep level 6 x5 min with PT present to discuss status Standing hamstring stretch at stairs 2x20 sec bilat (with cuing for improved posture/body mechanics) Standing hip flexor stretch with one foot on 2nd step with contralateral overhead reach x10 bilat Seated on green pball:  4D pelvic tilts, CW/CCW rotations x10 each direction Seated on green pball: alt UE/LE elevation/march 2x10 Prone on elbows x3 min (with minimal centralization of pain out of foot) Prone hip extension 2x10 Prone alt UE/LE extension 2x10 Sidelying clamshell 2x10 bilat Reverse clamshell 2x10 bilat Supine piriformis figure 4 stretch 2x20 sec bilat Supine dead bug isometric 3x10 sec Supine dead bug 2x10 Supine IT band stretch with strap 2x20 sec bilat Supine hip adduction stretch with strap 2x20 sec bilat Manual:  instrument assisted manual therapy with Addaday to right glutes/piriformis and IT band to promote increased tissue elasticity and decreased pain.   06/27/24 Discussed findings and developed POC Discussed DN (has had before but inconsistent - very interested in DN and understands OOP cost) Initiated HEP  PATIENT EDUCATION:  Education details: [email or web address] Person educated: Patient Education method: Programmer, multimedia, Facilities manager, Verbal cues, and Handouts Personnel officer) Education comprehension: verbalized understanding and returned demonstration  HOME EXERCISE PROGRAM: Access Code: [email or web address] URL: [email or web address] Date: 07/10/2024 Prepared by: Jarrell Lottie Sigman  Exercises - Seated Lateral Trunk Stretch on Swiss Ball  - 1 x daily - 7 x weekly - 1 sets - 3 reps - 10-30 sec hold - Standing Hip Flexor Stretch With Overhead Reach and Contralateral Sidebend  - 1 x daily - 7 x weekly - 1 sets - 3 reps - 10-30 sec hold - Clamshell  - 1 x  daily - 7 x weekly - 2 sets - 10 reps - Supine Figure 4 Piriformis Stretch  - 1 x daily - 7 x weekly - 2 reps - 20 sec hold - Supine ITB Stretch with Strap  - 1 x daily - 7 x weekly - 2 reps - 20 sec hold - Supine Dead Bug with Leg Extension  - 1 x daily - 7 x weekly - 2 sets - 10 reps - Static Prone on Elbows  - 1 x daily - 7 x weekly - 1-2 reps - 3-5 min hold - Prone Alternating Arm and Leg Lifts  - 1 x daily - 7 x weekly - 2 sets - 10 reps  ASSESSMENT:  CLINICAL IMPRESSION:  Ms Tamburri presents to skilled PT reporting that she is doing her exercises, but continues to have the burning sensation and it goes down into her toes.  During session today, continued to encourage pelvic mobility and core strengthening.  Patient was able to complete dead bug exercise, but did report abdominal muscle fatigue following.  While in prone on elbows position, patient did report that she did feel that some of the pain/burning was coming out of her right foot, so added this to HEP.  Patient with noted tight piriformis and IT band, so added these to HEP.  Patient is interested in dry needling next session to assess if that will help her muscles release. Patient provided with updated HEP, and she verbalizes that she has been accessing them via the computer.  Patient continues to require skilled PT to progress towards goal related activities.  OBJECTIVE IMPAIRMENTS: decreased knowledge of condition, decreased mobility, decreased ROM, decreased strength, increased fascial restrictions, increased muscle spasms, impaired flexibility, impaired sensation, improper body mechanics, postural dysfunction, and pain.   ACTIVITY LIMITATIONS: sitting, squatting, and stairs  PARTICIPATION LIMITATIONS: driving, community activity, and occupation  PERSONAL FACTORS: Time since onset of injury/illness/exacerbation are also affecting patient's functional outcome.   REHAB POTENTIAL: Excellent  CLINICAL DECISION MAKING:  Evolving/moderate complexity  EVALUATION COMPLEXITY: Moderate   GOALS: Goals reviewed with patient? Yes  SHORT TERM GOALS: Target date: 07/25/24  Pt will be ind with initial HEP Baseline: Goal status: Ongoing  2.  Pt will report reduced pain with sitting for work by at least 25% Baseline:  Goal status: INITIAL  3.  Pt will be able to achieve stretching positions for Rt hip flexor and quad without pain Baseline:  Goal status: INITIAL  4.  Pt will be able to demo symmetrical pelvis with 1/2 squat Baseline:  Goal status: INITIAL    LONG TERM GOALS: Target date: 08/22/24  Pt will be ind with advanced HEP Baseline:  Goal status: INITIAL  2.  Pt will be able to demo symmetrical pelvic control with full squat Baseline:  Goal status: INITIAL  3.  Pt will be able to sit for up  to 2 hours for short flights and work calls on Zoom without Rt hip locking up Baseline:  Goal status: INITIAL  4.  Pt will improve ODI to </= 20% to reduce disability level from moderate to min on ODI scale. Baseline: 13/50 = 26% Goal status: INITIAL  5.  Pt will report overall pain not to exceed 3/10 most days of the week. Baseline:  Goal status: INITIAL    PLAN:  PT FREQUENCY: 1-2x/week  PT DURATION: 8 weeks  PLANNED INTERVENTIONS: 97110-Therapeutic exercises, 97530- Therapeutic activity, 97112- Neuromuscular re-education, 97535- Self Care, 02859- Manual therapy, G0283- Electrical stimulation (unattended), [id number]- Electrical stimulation (manual), [id number]- Traction (mechanical), [id number]- Ionotophoresis 4mg /ml Dexamethasone , 79439 (1-2 muscles), 20561 (3+ muscles)- Dry Needling, Patient/Family education, Stair training, Taping, Joint mobilization, Spinal mobilization, Scar mobilization, Cryotherapy, and Moist heat.  PLAN FOR NEXT SESSION: DN Rt hip (anterior, lateral) and thigh (ITB/lateral quad, adductor, hip flexors), review HEP and progress trunk ROM/stretching, manual therapy for abdominal wall  mobility, Rt hip strength for abd/ER/ext, functional body mechanics training working on symmetrical pelvis in squat   Jarrell Laming, PT, DPT 07/10/24, 9:44 AM  [facility] [address], [address] [place], [place] [address] Phone # [phone] Fax [phone]

## 2024-07-11 ENCOUNTER — Ambulatory Visit: Admitting: Physical Therapy

## 2024-07-11 ENCOUNTER — Encounter: Payer: Self-pay | Admitting: Physical Therapy

## 2024-07-11 DIAGNOSIS — M6281 Muscle weakness (generalized): Secondary | ICD-10-CM

## 2024-07-11 DIAGNOSIS — R252 Cramp and spasm: Secondary | ICD-10-CM

## 2024-07-11 DIAGNOSIS — M25551 Pain in right hip: Secondary | ICD-10-CM

## 2024-07-11 NOTE — Therapy (Signed)
 OUTPATIENT PHYSICAL THERAPY TREATMENT NOTE   Patient Name: [name] MRN: [id number] DOB:Feb 22, 1976, 48 y.o., female Today's Date: 07/11/2024  END OF SESSION:  PT End of Session - 07/11/24 1036     Visit Number 3    Date for PT Re-Evaluation 08/22/24    Authorization Type BCBS (DN signed 9/12)    PT Start Time 0937    PT Stop Time 1017    PT Time Calculation (min) 40 min    Activity Tolerance Patient tolerated treatment well    Behavior During Therapy WFL for tasks assessed/performed           Past Medical History:  Diagnosis Date   Abnormal uterine bleeding (AUB)    Allergy    Anemia    Anxiety    Arthritis    FEET   Asthma    Chronic kidney disease    Depression    Environmental allergies    GERD (gastroesophageal reflux disease)    Heavy menstrual bleeding    History of pregnancy induced hypertension    Hypertension    Wears contact lenses    Past Surgical History:  Procedure Laterality Date   ABDOMINOPLASTY  05/2020   per PT THEN HAD REVISION 04/ 2022   AUGMENTATION MAMMAPLASTY Bilateral 06/05/2023   CESAREAN SECTION     11-30-2005;    @[facility]  by dr darcel  10-28-2007   COMBINED AUGMENTATION MAMMAPLASTY AND ABDOMINOPLASTY  06/05/2023   Also had liposuction   DILITATION & CURRETTAGE/HYSTROSCOPY WITH NOVASURE ABLATION N/A 10/04/2022   Procedure: DILATATION & CURETTAGE/HYSTEROSCOPY WITH NOVASURE ABLATION;  Surgeon: Cleatus Moccasin, MD;  Location: [facility];  Service: Gynecology;  Laterality: N/A;   NASAL SINUS SURGERY  1995   Patient Active Problem List   Diagnosis Date Noted   Lichen sclerosus 06/02/2024   S/P endometrial ablation 06/02/2024   History of abdominoplasty 06/02/2024   History of heavy vaginal bleeding 10/04/2022   Vitamin D  deficiency 05/14/2022    PCP:   REFERRING PROVIDER: Cleotilde Ronal RAMAN, MD  REFERRING DIAG: [phone] (ICD-10-CM) - History of abdominoplasty  Rationale for Evaluation and Treatment:  Rehabilitation  THERAPY DIAG:  Cramp and spasm  Pain in right hip  Muscle weakness (generalized)  ONSET DATE: Aug 2024 revision abdominoplasty with augmentation mammaplasty, initial abdominoplasty in 2021  SUBJECTIVE:                                                                                                                                                                                           SUBJECTIVE STATEMENT: Patient reports she has 3-4/10 pain today. She is ready to try dry needling.  PERTINENT HISTORY:  Initial abdomoniplasty in 2021 to repair rectus diastasis/hernia which then failed and needed revision in 2024.  My Rt hip is a mess - better now than it has been.  I did a ton of pure barre 18mos ago and it got really bad Had big babies - 10 and 11lb Gets kneecap pain - especially descending stairs Started HRT about 30 days ago  PAIN:  PAIN:  Are you having pain? Yes NPRS scale: 3/10 Pain location: anterior abdomen, Rt hip, Rt kneecap Pain orientation: Right, anterior and across lower abdomen PAIN TYPE: aching, burning, and tight Pain description: intermittent and constant  Aggravating factors: sitting Relieving factors: stretching, moving   PRECAUTIONS: None  RED FLAGS: None   WEIGHT BEARING RESTRICTIONS: No  FALLS:  Has patient fallen in last 6 months? 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  DIAGNOSTIC FINDINGS:    PATIENT SURVEYS:  Modified Oswestry:  MODIFIED  OSWESTRY DISABILITY SCALE  Date: 06/27/24 Score  Pain intensity 1 = The pain is bad, but I can manage without having to take (1) I can stand as long as I want but, it increases my pain. pain medication.  2. Personal care (washing, dressing, etc.) 0 =  I can take care of myself normally without causing increased pain.  3. Lifting 0 = I can lift heavy weights without increased pain.  4. Walking 3 =  Pain prevents me from walking more than  mile.  5. Sitting 2 =  Pain prevents me from sitting more than 1 hour.  6. Standing 2 =  Pain prevents me from standing more than 1 hour  7. Sleeping 0 = Pain does not prevent me from sleeping well.  8. Social Life 1 =  My social life is normal, but it increases my level of pain.  9. Traveling 2 =  My pain restricts my travel over 2 hours.  10. Employment/ Homemaking 2 = I can perform most of my homemaking/job duties, but pain prevents me from performing more physically stressful activities (eg, lifting, vacuuming).  Total 13/50, 26%   Interpretation of scores: Score Category Description  0-20% Minimal Disability The patient can cope with most living activities. Usually no treatment is indicated apart from advice on lifting, sitting and exercise  21-40% Moderate Disability The patient experiences more pain and difficulty with sitting, lifting and standing. Travel and social life are more difficult and they may be disabled from work. Personal care, sexual activity and sleeping are not grossly affected, and the patient can usually be managed by conservative means  41-60% Severe Disability Pain remains the main problem in this group, but activities of daily living are affected. These patients require a detailed investigation  61-80% Crippled Back pain impinges on all aspects of the patient's life. Positive intervention is required  81-100% Bed-bound  These patients are either bed-bound or exaggerating their symptoms  Bluford FORBES [name] [name] [name], et al. Surgery  versus conservative management of stable thoracolumbar fracture: the PRESTO feasibility RCT. [place] ([place]):  NIHR Journals Library; 2021 Nov. (Health Technology Assessment, No. 25.62.) Appendix 3, Oswestry Disability Index category descriptors. Available from: FindJewelers.cz  Minimally Clinically Important Difference (MCID) = 12.8%  COGNITION: Overall cognitive status: Within functional limits for tasks assessed     SENSATION: Will sometimes get numbness on Rt hip  MUSCLE LENGTH: Limited ITB bil  Limited hip flexor and quad on Rt side - very tight and painful when assessing Limited adductors on Rt side  POSTURE: right pelvic obliquity, Rt ilial outflare  PALPATION: Muscle spasms with trigger points present: Rt ITB/lateral quad, iliacus, psoas, gluteals, piriformis, adductors Rt patella limited mobility with crepitus, tender bil distal ITB  LUMBAR ROM:   AROM eval  Flexion Fingers to toes no pain  Extension End range limited with Pulling through abdominal wall  Right lateral flexion Contralateral trunk tightness, pain in Rt hip  Left lateral flexion Contralateral trunk tightness  Right rotation 50% no pain  Left rotation 50% no pain   (Blank rows = not tested)  LOWER EXTREMITY ROM:    Rt hip hypermobile in IR/ER Rt hip Pain into FADIR and FABER Rt anterior hip and thigh pulling and pain with hip extension, limited by pain 50%  LOWER EXTREMITY MMT:   Rt hip abd and ER 4/5, all other muscle groups 5/5 bil LE  LUMBAR SPECIAL TESTS:  + Rt FADIR AND FABER Rt hip hypermobile for both IR/ER + Ober's test on Rt  FUNCTIONAL TESTS:  Significant pelvic rotation in squat with drop of Rt hemipelvis Increased pronation of Rt foot  GAIT: Distance walked:  Assistive device utilized: None Level of assistance: Complete Independence Comments: no gait deviations  TREATMENT DATE:  07/11/2024: Nustep level 6 x5 min with PT present to discuss  status Supine IT band stretch with strap 2x20 sec bilat Supine hip adduction stretch with strap 2x20 sec bilat Trigger Point Dry Needling  Initial Treatment: Pt instructed on Dry Needling rational, procedures, and possible side effects. Pt instructed to expect mild to moderate muscle soreness later in the day and/or into the next day.  Pt instructed in methods to reduce muscle soreness. Pt instructed to continue prescribed HEP. Patient verbalized understanding of these instructions and education.   Patient Verbal Consent Given: Yes Education Handout Provided: Previously Provided Muscles Treated: Rt lateral quad, TFL, Rt glutes and piriformis  Electrical Stimulation Performed: No Treatment Response/Outcome: Utilized skilled palpation to identify trigger points.  During dry needling able to palpate muscle twitch and muscle elongation  Soft tissue mobilization performed to further promote tissue elongation and decreased pain.         07/10/2024: Nustep level 6 x5 min with PT present to discuss status Standing hamstring stretch at stairs 2x20 sec bilat (with cuing for improved posture/body mechanics) Standing hip flexor stretch with one foot on 2nd step with contralateral overhead reach x10 bilat Seated on green pball:  4D pelvic tilts, CW/CCW rotations x10 each direction Seated on green pball: alt UE/LE elevation/march 2x10 Prone on elbows x3 min (with minimal centralization of pain out of foot) Prone hip extension 2x10 Prone alt UE/LE extension 2x10 Sidelying clamshell 2x10 bilat Reverse clamshell 2x10 bilat Supine piriformis figure 4 stretch 2x20 sec bilat Supine dead bug isometric 3x10 sec Supine dead bug 2x10 Supine IT band stretch with strap 2x20 sec bilat Supine hip adduction stretch with strap 2x20 sec bilat Manual:  instrument assisted manual therapy with Addaday to right glutes/piriformis and IT band to promote increased tissue elasticity and decreased  pain.  06/27/24 Discussed findings and developed POC Discussed DN (has had before but inconsistent - very interested in DN and understands OOP cost) Initiated HEP                                                                                                                                  PATIENT EDUCATION:  Education details: [email or web address] Person educated: Patient Education method: Programmer, multimedia, Facilities manager, Verbal cues, and Handouts Personnel officer) Education comprehension: verbalized understanding and returned demonstration  HOME EXERCISE PROGRAM: Access Code: [email or web address] URL: [email or web address] Date: 07/10/2024 Prepared by: Jarrell Menke  Exercises - Seated Lateral Trunk Stretch on Swiss Ball  - 1 x daily - 7 x weekly - 1 sets - 3 reps - 10-30 sec hold - Standing Hip Flexor Stretch With Overhead Reach and Contralateral Sidebend  - 1 x daily - 7 x weekly - 1 sets - 3 reps - 10-30 sec hold - Clamshell  - 1 x daily - 7 x weekly - 2 sets - 10 reps - Supine Figure 4 Piriformis Stretch  - 1 x daily - 7 x weekly - 2 reps - 20 sec hold - Supine ITB Stretch with Strap  - 1 x daily - 7 x weekly - 2 reps - 20 sec hold - Supine Dead Bug with Leg Extension  - 1 x daily - 7 x weekly - 2 sets - 10 reps - Static Prone on Elbows  - 1 x daily - 7 x weekly - 1-2 reps - 3-5 min hold - Prone Alternating Arm and Leg Lifts  - 1 x daily - 7 x weekly - 2 sets - 10 reps  ASSESSMENT:  CLINICAL IMPRESSION: Patient presents to skilled therapy with mild constant hip pain. Patient responded well to dry needling today and verbalized feeling an achy sensation and muscle relaxation. PT educated patient on follow up care after dry needling and to perform HEP exercises. PT used soft tissue techniques after dry needling for further muscle relaxation. Patient will benefit from skilled PT to address the below impairments and improve overall function.   OBJECTIVE IMPAIRMENTS: decreased knowledge of  condition, decreased mobility, decreased ROM, decreased strength, increased fascial restrictions, increased muscle spasms, impaired flexibility, impaired sensation, improper body mechanics, postural dysfunction, and pain.   ACTIVITY LIMITATIONS: sitting, squatting, and stairs  PARTICIPATION LIMITATIONS: driving, community activity, and occupation  PERSONAL FACTORS: Time since onset of injury/illness/exacerbation are also affecting patient's functional outcome.   REHAB POTENTIAL: Excellent  CLINICAL DECISION MAKING: Evolving/moderate complexity  EVALUATION COMPLEXITY: Moderate   GOALS: Goals reviewed with patient? Yes  SHORT TERM GOALS: Target date: 07/25/24  Pt will be ind with initial HEP Baseline: Goal status: Ongoing  2.  Pt will report reduced pain with sitting for work by at least 25% Baseline:  Goal status: INITIAL  3.  Pt will be able to achieve stretching positions for Rt hip flexor and quad without pain Baseline:  Goal status: INITIAL  4.  Pt will be able to demo symmetrical pelvis with 1/2 squat Baseline:  Goal status: INITIAL    LONG TERM GOALS: Target date: 08/22/24  Pt will be ind with advanced HEP Baseline:  Goal status: INITIAL  2.  Pt will be able to demo symmetrical pelvic control with full squat Baseline:  Goal status: INITIAL  3.  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  Pt will improve ODI to </= 20% to reduce disability level from moderate to min on ODI scale. Baseline: 13/50 = 26% Goal status: INITIAL  5.  Pt will report overall pain not to exceed 3/10 most days of the week. Baseline:  Goal status: INITIAL    PLAN:  PT FREQUENCY: 1-2x/week  PT DURATION: 8 weeks  PLANNED INTERVENTIONS: 97110-Therapeutic exercises, 97530- Therapeutic activity, 97112- Neuromuscular re-education, 97535- Self Care, 02859- Manual therapy, [id number]-  Electrical stimulation (unattended), [phone]- Electrical stimulation (manual), [id number]- Traction (mechanical), [id number]- Ionotophoresis 4mg /ml Dexamethasone , 79439 (1-2 muscles), 20561 (3+ muscles)- Dry Needling, Patient/Family education, Stair training, Taping, Joint mobilization, Spinal mobilization, Scar mobilization, Cryotherapy, and Moist heat.  PLAN FOR NEXT SESSION: assess response to D1 ; perform again if patient is interested (maybe add in R  hip flexors) DN Rt hip (anterior, lateral) ; cat cow; prone hip IR/ER; child's poser manual therapy for abdominal wall mobility, Rt hip strength for abd/ER/ext, functional body mechanics training working on symmetrical pelvis in squat    Kristeen Sar, PT 07/11/24 10:37 AM [facility] [address], [address] [place], [place] [address] Phone # [phone] Fax [phone]

## 2024-07-15 ENCOUNTER — Encounter: Payer: Self-pay | Admitting: Physical Therapy

## 2024-07-15 ENCOUNTER — Ambulatory Visit: Admitting: Physical Therapy

## 2024-07-15 DIAGNOSIS — M6281 Muscle weakness (generalized): Secondary | ICD-10-CM

## 2024-07-15 DIAGNOSIS — M25551 Pain in right hip: Secondary | ICD-10-CM

## 2024-07-15 DIAGNOSIS — R252 Cramp and spasm: Secondary | ICD-10-CM

## 2024-07-15 NOTE — Therapy (Signed)
 OUTPATIENT PHYSICAL THERAPY TREATMENT NOTE   Patient Name: [name] MRN: [id number] DOB:Dec 15, 1975, 48 y.o., female Today's Date: 07/15/2024  END OF SESSION:  PT End of Session - 07/15/24 1501     Visit Number 4    Date for PT Re-Evaluation 08/22/24    Authorization Type [employer] (DN signed 9/12)    PT Start Time 1400    PT Stop Time 1449    PT Time Calculation (min) 49 min    Activity Tolerance Patient tolerated treatment well    Behavior During Therapy WFL for tasks assessed/performed            Past Medical History:  Diagnosis Date   Abnormal uterine bleeding (AUB)    Allergy    Anemia    Anxiety    Arthritis    FEET   Asthma    Chronic kidney disease    Depression    Environmental allergies    GERD (gastroesophageal reflux disease)    Heavy menstrual bleeding    History of pregnancy induced hypertension    Hypertension    Wears contact lenses    Past Surgical History:  Procedure Laterality Date   ABDOMINOPLASTY  05/2020   per PT THEN HAD REVISION 04/ 2022   AUGMENTATION MAMMAPLASTY Bilateral 06/05/2023   CESAREAN SECTION     11-30-2005;    @[facility]  by dr darcel  10-28-2007   COMBINED AUGMENTATION MAMMAPLASTY AND ABDOMINOPLASTY  06/05/2023   Also had liposuction   DILITATION & CURRETTAGE/HYSTROSCOPY WITH NOVASURE ABLATION N/A 10/04/2022   Procedure: DILATATION & CURETTAGE/HYSTEROSCOPY WITH NOVASURE ABLATION;  Surgeon: Cleatus Moccasin, MD;  Location: [facility] [place];  Service: Gynecology;  Laterality: N/A;   NASAL SINUS SURGERY  1995   Patient Active Problem List   Diagnosis Date Noted   Lichen sclerosus 06/02/2024   S/P endometrial ablation 06/02/2024   History of abdominoplasty 06/02/2024   History of heavy vaginal bleeding 10/04/2022   Vitamin D  deficiency 05/14/2022    PCP:   REFERRING PROVIDER: Cleotilde Ronal RAMAN, MD  REFERRING DIAG: [phone] (ICD-10-CM) - History of abdominoplasty  Rationale for Evaluation and Treatment:  Rehabilitation  THERAPY DIAG:  Cramp and spasm  Pain in right hip  Muscle weakness (generalized)  ONSET DATE: Aug 2024 revision abdominoplasty with augmentation mammaplasty, initial abdominoplasty in 2021  SUBJECTIVE:                                                                                                                                                                                           SUBJECTIVE STATEMENT: Patient responded well to dry needling. She was very sore afterwards. She was  a little itchy in her glute area.  PERTINENT HISTORY:  Initial abdomoniplasty in 2021 to repair rectus diastasis/hernia which then failed and needed revision in 2024.  My Rt hip is a mess - better now than it has been.  I did a ton of pure barre 18mos ago and it got really bad Had big babies - 10 and 11lb Gets kneecap pain - especially descending stairs Started HRT about 30 days ago  PAIN:  PAIN:  Are you having pain? Yes NPRS scale: 3/10 Pain location: anterior abdomen, Rt hip, Rt kneecap Pain orientation: Right, anterior and across lower abdomen PAIN TYPE: aching, burning, and tight Pain description: intermittent and constant  Aggravating factors: sitting Relieving factors: stretching, moving   PRECAUTIONS: None  RED FLAGS: None   WEIGHT BEARING RESTRICTIONS: No  FALLS:  Has patient fallen in last 6 months? 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  DIAGNOSTIC FINDINGS:    PATIENT SURVEYS:   Modified Oswestry:  MODIFIED OSWESTRY DISABILITY SCALE  Date: 06/27/24 Score  Pain intensity 1 = The pain is bad, but I can manage without having to take (1) I can stand as long as I want but, it increases my pain. pain medication.  2. Personal care (washing, dressing, etc.) 0 =  I can take care of myself normally without causing increased pain.  3. Lifting 0 = I can lift heavy weights without increased pain.  4. Walking 3 =  Pain prevents me from walking more than  mile.  5. Sitting 2 =  Pain prevents me from sitting more than 1 hour.  6. Standing 2 =  Pain prevents me from standing more than 1 hour  7. Sleeping 0 = Pain does not prevent me from sleeping well.  8. Social Life 1 =  My social life is normal, but it increases my level of pain.  9. Traveling 2 =  My pain restricts my travel over 2 hours.  10. Employment/ Homemaking 2 = I can perform most of my homemaking/job duties, but pain prevents me from performing more physically stressful activities (eg, lifting, vacuuming).  Total 13/50, 26%   Interpretation of scores: Score Category Description  0-20% Minimal Disability The patient can cope with most living activities. Usually no treatment is indicated apart from advice on lifting, sitting and exercise  21-40% Moderate Disability The patient experiences more pain and difficulty with sitting, lifting and standing. Travel and social life are more difficult and they may be disabled from work. Personal care, sexual activity and sleeping are not grossly affected, and the patient can usually be managed by conservative means  41-60% Severe Disability Pain remains the main problem in this group, but activities of daily living are affected. These patients require a detailed investigation  61-80% Crippled Back pain impinges on all aspects of the patient's life. Positive intervention is required  81-100% Bed-bound  These patients are either bed-bound or exaggerating their symptoms  Bluford BRAVO,  [name] [name] [name], et al. Surgery versus conservative management of stable  thoracolumbar fracture: the PRESTO feasibility RCT. [place] ([place]): [employer]; 2021 Nov. [facility] Technology Assessment, No. 25.62.) Appendix 3, Oswestry Disability Index category descriptors. Available from: FindJewel[employer].cz  Minimally Clinically Important Difference (MCID) = 12.8%  COGNITION: Overall cognitive status: Within functional limits for tasks assessed     SENSATION: Will sometimes get numbness on Rt hip  MUSCLE LENGTH: Limited ITB bil  Limited hip flexor and quad on Rt side - very tight and painful when assessing Limited adductors on Rt side  POSTURE: right pelvic obliquity, Rt ilial outflare  PALPATION: Muscle spasms with trigger points present: Rt ITB/lateral quad, iliacus, psoas, gluteals, piriformis, adductors Rt patella limited mobility with crepitus, tender bil distal ITB  LUMBAR ROM:   AROM eval  Flexion Fingers to toes no pain  Extension End range limited with Pulling through abdominal wall  Right lateral flexion Contralateral trunk tightness, pain in Rt hip  Left lateral flexion Contralateral trunk tightness  Right rotation 50% no pain  Left rotation 50% no pain   (Blank rows = not tested)  LOWER EXTREMITY ROM:    Rt hip hypermobile in IR/ER Rt hip Pain into FADIR and FABER Rt anterior hip and thigh pulling and pain with hip extension, limited by pain 50%  LOWER EXTREMITY MMT:   Rt hip abd and ER 4/5, all other muscle groups 5/5 bil LE  LUMBAR SPECIAL TESTS:  + Rt FADIR AND FABER Rt hip hypermobile for both IR/ER + Ober's test on Rt  FUNCTIONAL TESTS:  Significant pelvic rotation in squat with drop of Rt hemipelvis Increased pronation of Rt foot  GAIT: Distance walked:  Assistive device utilized: None Level of assistance: Complete Independence Comments: no gait deviations  TREATMENT DATE:  07/15/2024: Nustep level 7 x5  min with PT present to discuss status Cat Cow x 10  Child's Pose x 45sec  Prone hip IR/ER x 10 SL bridge x 10 bilateral (harder on left)  Sidelying double clam x 10  Trigger Point Dry Needling  Subsequent Treatment: Instructions provided previously at initial dry needling treatment.   Patient Verbal Consent Given: Yes Education Handout Provided: Previously Provided Muscles Treated: Rt lateral quad, TFL, Rt glutes and piriformis  Electrical Stimulation Performed: No Treatment Response/Outcome: Utilized skilled palpation to identify trigger points.  During dry needling able to palpate muscle twitch and muscle elongation  Soft tissue mobilization performed to further promote tissue elongation and decreased pain.     Pin and stretch to right piriformis        07/11/2024: Nustep level 6 x5 min with PT present to discuss status Supine IT band stretch with strap 2x20 sec bilat Supine hip adduction stretch with strap 2x20 sec bilat Trigger Point Dry Needling  Initial Treatment: Pt instructed on Dry Needling rational, procedures, and possible side effects. Pt instructed to expect mild to moderate muscle soreness later in the day and/or into the next day.  Pt instructed in methods to reduce muscle soreness. Pt instructed to continue prescribed HEP. Patient verbalized understanding of these instructions and education.   Patient Verbal Consent Given: Yes Education Handout Provided: Previously Provided Muscles Treated: Rt lateral quad, TFL, Rt glutes and piriformis  Electrical Stimulation Performed: No Treatment Response/Outcome: Utilized skilled palpation to identify trigger points.  During dry needling able to palpate muscle twitch and muscle elongation  Soft tissue mobilization performed to further promote tissue elongation and decreased pain.         07/10/2024: Nustep level 6 x5 min with PT present to  discuss status Standing hamstring stretch at stairs 2x20 sec bilat (with  cuing for improved posture/body mechanics) Standing hip flexor stretch with one foot on 2nd step with contralateral overhead reach x10 bilat Seated on green pball:  4D pelvic tilts, CW/CCW rotations x10 each direction Seated on green pball: alt UE/LE elevation/march 2x10 Prone on elbows x3 min (with minimal centralization of pain out of foot) Prone hip extension 2x10 Prone alt UE/LE extension 2x10 Sidelying clamshell 2x10 bilat Reverse clamshell 2x10 bilat Supine piriformis figure 4 stretch 2x20 sec bilat Supine dead bug isometric 3x10 sec Supine dead bug 2x10 Supine IT band stretch with strap 2x20 sec bilat Supine hip adduction stretch with strap 2x20 sec bilat Manual:  instrument assisted manual therapy with Addaday to right glutes/piriformis and IT band to promote increased tissue elasticity and decreased pain.                                                                                                    PATIENT EDUCATION:  Education details: [email or web address] Person educated: Patient Education method: Programmer, multimedia, Facilities manager, Verbal cues, and Handouts Personnel officer) Education comprehension: verbalized understanding and returned demonstration  HOME EXERCISE PROGRAM: Access Code: [email or web address] URL: [email or web address] Date: 07/15/2024 Prepared by: Kristeen Sar  Exercises - Seated Lateral Trunk Stretch on Swiss Ball  - 1 x daily - 7 x weekly - 1 sets - 3 reps - 10-30 sec hold - Standing Hip Flexor Stretch With Overhead Reach and Contralateral Sidebend  - 1 x daily - 7 x weekly - 1 sets - 3 reps - 10-30 sec hold - Clamshell  - 1 x daily - 7 x weekly - 2 sets - 10 reps - Supine Figure 4 Piriformis Stretch  - 1 x daily - 7 x weekly - 2 reps - 20 sec hold - Supine ITB Stretch with Strap  - 1 x daily - 7 x weekly - 2 reps - 20 sec hold - Supine Dead Bug with Leg Extension  - 1 x daily - 7 x weekly - 2 sets - 10 reps - Static Prone on Elbows  - 1 x daily - 7 x weekly - 1-2 reps -  3-5 min hold - Prone Alternating Arm and Leg Lifts  - 1 x daily - 7 x weekly - 2 sets - 10 reps - Single Leg Bridge  - 1 x daily - 7 x weekly - 1-2 sets - 10 reps - Side Plank with Clam and Resistance  - 1 x daily - 7 x weekly - 1 sets - 10 reps - Single Leg Sit to Stand with Arms Crossed  - 1 x daily - 7 x weekly - 1 sets - 10 reps ASSESSMENT:  CLINICAL IMPRESSION: Samika responded well to dry needling last treatment session. She verbalized increased soreness but overall felt a lot better. She has not had the tingling in her toes or the wrapping sensation around her thigh. While palpating glutes PT felt a firm nodules that did not feel like a trigger point. PT got a second opinion form another PT and she agreed the nodule  did not feel like a muscle trigger point. Educated patient to stay aware of any itchy, color changes, or tenderness in area. Suggested patient to possibly get an ultrasound in the area. Updated HEP to include exercise progressions.   OBJECTIVE IMPAIRMENTS: decreased knowledge of condition, decreased mobility, decreased ROM, decreased strength, increased fascial restrictions, increased muscle spasms, impaired flexibility, impaired sensation, improper body mechanics, postural dysfunction, and pain.   ACTIVITY LIMITATIONS: sitting, squatting, and stairs  PARTICIPATION LIMITATIONS: driving, community activity, and occupation  PERSONAL FACTORS: Time since onset of injury/illness/exacerbation are also affecting patient's functional outcome.   REHAB POTENTIAL: Excellent  CLINICAL DECISION MAKING: Evolving/moderate complexity  EVALUATION COMPLEXITY: Moderate   GOALS: Goals reviewed with patient? Yes  SHORT TERM GOALS: Target date: 07/25/24  Pt will be ind with initial HEP Baseline: Goal status: Ongoing  2.  Pt will report reduced pain with sitting for work by at least 25% Baseline:  Goal status: INITIAL  3.  Pt will be able to achieve stretching positions for Rt  hip flexor and quad without pain Baseline:  Goal status: INITIAL  4.  Pt will be able to demo symmetrical pelvis with 1/2 squat Baseline:  Goal status: INITIAL    LONG TERM GOALS: Target date: 08/22/24  Pt will be ind with advanced HEP Baseline:  Goal status: INITIAL  2.  Pt will be able to demo symmetrical pelvic control with full squat Baseline:  Goal status: INITIAL  3.  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  Pt will improve ODI to </= 20% to reduce disability level from moderate to min on ODI scale. Baseline: 13/50 = 26% Goal status: INITIAL  5.  Pt will report overall pain not to exceed 3/10 most days of the week. Baseline:  Goal status: INITIAL    PLAN:  PT FREQUENCY: 1-2x/week  PT DURATION: 8 weeks  PLANNED INTERVENTIONS: 97110-Therapeutic exercises, 97530- Therapeutic activity, 97112- Neuromuscular re-education, 97535- Self Care, 02859- Manual therapy, G0283- Electrical stimulation (unattended), [phone]- Electrical stimulation (manual), [id number]- Traction (mechanical), [id number]- Ionotophoresis 4mg /ml Dexamethasone , 79439 (1-2 muscles), 20561 (3+ muscles)- Dry Needling, Patient/Family education, Stair training, Taping, Joint mobilization, Spinal mobilization, Scar mobilization, Cryotherapy, and Moist heat.  PLAN FOR NEXT SESSION: assess response to D2 ; proximal strengthening; cupping to abs ; strength for abd/ER/ext, functional body mechanics training working on symmetrical pelvis in squat    Kristeen Sar, PT 07/15/24 3:02 PM [facility] [address] [place], [place] [address] Phone # [phone] Fax [phone]

## 2024-07-17 ENCOUNTER — Encounter: Payer: Self-pay | Admitting: Physical Therapy

## 2024-07-17 ENCOUNTER — Ambulatory Visit: Admitting: Physical Therapy

## 2024-07-17 DIAGNOSIS — M25551 Pain in right hip: Secondary | ICD-10-CM

## 2024-07-17 DIAGNOSIS — M6281 Muscle weakness (generalized): Secondary | ICD-10-CM

## 2024-07-17 DIAGNOSIS — R252 Cramp and spasm: Secondary | ICD-10-CM | POA: Diagnosis not present

## 2024-07-17 NOTE — Therapy (Signed)
 OUTPATIENT PHYSICAL THERAPY TREATMENT NOTE   Patient Name: [name] MRN: [id number] DOB:1976-04-03, 48 y.o., female Today's Date: 07/17/2024  END OF SESSION:  PT End of Session - 07/17/24 1628     Visit Number 5    Date for Recertification  08/22/24    Authorization Type [employer] (DN signed 9/12)    PT Start Time 1531    PT Stop Time 1615    PT Time Calculation (min) 44 min    Activity Tolerance Patient tolerated treatment well    Behavior During Therapy WFL for tasks assessed/performed             Past Medical History:  Diagnosis Date   Abnormal uterine bleeding (AUB)    Allergy    Anemia    Anxiety    Arthritis    FEET   Asthma    Chronic kidney disease    Depression    Environmental allergies    GERD (gastroesophageal reflux disease)    Heavy menstrual bleeding    History of pregnancy induced hypertension    Hypertension    Wears contact lenses    Past Surgical History:  Procedure Laterality Date   ABDOMINOPLASTY  05/2020   per PT THEN HAD REVISION 04/ 2022   AUGMENTATION MAMMAPLASTY Bilateral 06/05/2023   CESAREAN SECTION     11-30-2005;    @[facility]  by dr darcel  10-28-2007   COMBINED AUGMENTATION MAMMAPLASTY AND ABDOMINOPLASTY  06/05/2023   Also had liposuction   DILITATION & CURRETTAGE/HYSTROSCOPY WITH NOVASURE ABLATION N/A 10/04/2022   Procedure: DILATATION & CURETTAGE/HYSTEROSCOPY WITH NOVASURE ABLATION;  Surgeon: Cleatus Moccasin, MD;  Location: [facility];  Service: Gynecology;  Laterality: N/A;   NASAL SINUS SURGERY  1995   Patient Active Problem List   Diagnosis Date Noted   Lichen sclerosus 06/02/2024   S/P endometrial ablation 06/02/2024   History of abdominoplasty 06/02/2024   History of heavy vaginal bleeding 10/04/2022   Vitamin D  deficiency 05/14/2022    PCP:   REFERRING PROVIDER: Cleotilde Ronal RAMAN, MD  REFERRING DIAG: [phone] (ICD-10-CM) - History of abdominoplasty  Rationale for Evaluation and Treatment:  Rehabilitation  THERAPY DIAG:  Cramp and spasm  Pain in right hip  Muscle weakness (generalized)  ONSET DATE: Aug 2024 revision abdominoplasty with augmentation mammaplasty, initial abdominoplasty in 2021  SUBJECTIVE:                                                                                                                                                                                           SUBJECTIVE STATEMENT: Patient reports she is doing good today. She is not currently having  any pain. She was less sore after dry needling.  PERTINENT HISTORY:  Initial abdomoniplasty in 2021 to repair rectus diastasis/hernia which then failed and needed revision in 2024.  My Rt hip is a mess - better now than it has been.  I did a ton of pure barre 18mos ago and it got really bad Had big babies - 10 and 11lb Gets kneecap pain - especially descending stairs Started HRT about 30 days ago  PAIN:  PAIN:  Are you having pain? Yes NPRS scale: 3/10 Pain location: anterior abdomen, Rt hip, Rt kneecap Pain orientation: Right, anterior and across lower abdomen PAIN TYPE: aching, burning, and tight Pain description: intermittent and constant  Aggravating factors: sitting Relieving factors: stretching, moving   PRECAUTIONS: None  RED FLAGS: None   WEIGHT BEARING RESTRICTIONS: No  FALLS:  Has patient fallen in last 6 months? 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  DIAGNOSTIC FINDINGS:    PATIENT  SURVEYS:  Modified Oswestry:  MODIFIED OSWESTRY DISABILITY SCALE  Date: 06/27/24 Score  Pain intensity 1 = The pain is bad, but I can manage without having to take (1) I can stand as long as I want but, it increases my pain. pain medication.  2. Personal care (washing, dressing, etc.) 0 =  I can take care of myself normally without causing increased pain.  3. Lifting 0 = I can lift heavy weights without increased pain.  4. Walking 3 =  Pain prevents me from walking more than  mile.  5. Sitting 2 =  Pain prevents me from sitting more than 1 hour.  6. Standing 2 =  Pain prevents me from standing more than 1 hour  7. Sleeping 0 = Pain does not prevent me from sleeping well.  8. Social Life 1 =  My social life is normal, but it increases my level of pain.  9. Traveling 2 =  My pain restricts my travel over 2 hours.  10. Employment/ Homemaking 2 = I can perform most of my homemaking/job duties, but pain prevents me from performing more physically stressful activities (eg, lifting, vacuuming).  Total 13/50, 26%   Interpretation of scores: Score Category Description  0-20% Minimal Disability The patient can cope with most living activities. Usually no treatment is indicated apart from advice on lifting, sitting and exercise  21-40% Moderate Disability The patient experiences more pain and difficulty with sitting, lifting and standing. Travel and social life are more difficult and they may be disabled from work. Personal care, sexual activity and sleeping are not grossly affected, and the patient can usually be managed by conservative means  41-60% Severe Disability Pain remains the main problem in this group, but activities of daily living are affected. These patients require a detailed investigation  61-80% Crippled Back pain impinges on all aspects of the patient's life. Positive intervention is required  81-100% Bed-bound  These patients are either bed-bound or exaggerating their symptoms  Bluford BRAVO,  [name] [name] [name], et al. Surgery versus conservative management  of stable thoracolumbar fracture: the PRESTO feasibility RCT. [place]): [employer]; 2021 Nov. [facility] Technology Assessment, No. 25.62.) Appendix 3, Oswestry Disability Index category descriptors. Available from: FindJewelers.cz  Minimally Clinically Important Difference (MCID) = 12.8%  COGNITION: Overall cognitive status: Within functional limits for tasks assessed     SENSATION: Will sometimes get numbness on Rt hip  MUSCLE LENGTH: Limited ITB bil  Limited hip flexor and quad on Rt side - very tight and painful when assessing Limited adductors on Rt side  POSTURE: right pelvic obliquity, Rt ilial outflare  PALPATION: Muscle spasms with trigger points present: Rt ITB/lateral quad, iliacus, psoas, gluteals, piriformis, adductors Rt patella limited mobility with crepitus, tender bil distal ITB  LUMBAR ROM:   AROM eval  Flexion Fingers to toes no pain  Extension End range limited with Pulling through abdominal wall  Right lateral flexion Contralateral trunk tightness, pain in Rt hip  Left lateral flexion Contralateral trunk tightness  Right rotation 50% no pain  Left rotation 50% no pain   (Blank rows = not tested)  LOWER EXTREMITY ROM:    Rt hip hypermobile in IR/ER Rt hip Pain into FADIR and FABER Rt anterior hip and thigh pulling and pain with hip extension, limited by pain 50%  LOWER EXTREMITY MMT:   Rt hip abd and ER 4/5, all other muscle groups 5/5 bil LE  LUMBAR SPECIAL TESTS:  + Rt FADIR AND FABER Rt hip hypermobile for both IR/ER + Ober's test on Rt  FUNCTIONAL TESTS:  Significant pelvic rotation in squat with drop of Rt hemipelvis Increased pronation of Rt foot  GAIT: Distance walked:  Assistive device utilized: None Level of assistance: Complete Independence Comments: no gait deviations  TREATMENT DATE:  07/17/2024: Nustep level 6 x5  min with PT present to discuss status Iso hip abduction with purple ball Bridge with hip adduction with purple ball x 12 SL bridge x 12 bilateral  Iso bridge hold x 1 min Hooklying alt hand and knee press with purple ball x 10 bilateral  90/90 heel tap 2 x 20 Half kneeling chop with blue TB x 15 bilateral  Plank off plinth + march x 20 Around the world's 15# KB x 10 each direction 15# KB at shoulder height  march x 12 each side Manual: scar tissue massage with silicon cup for improved tissue mobility and decreased restriction     07/15/2024: Nustep level 7 x5 min with PT present to discuss status Cat Cow x 10  Child's Pose x 45sec  Prone hip IR/ER x 10 SL bridge x 10 bilateral (harder on left)  Sidelying double clam x 10  Trigger Point Dry Needling  Subsequent Treatment: Instructions provided previously at initial dry needling treatment.   Patient Verbal Consent Given: Yes Education Handout Provided: Previously Provided Muscles Treated: Rt lateral quad, TFL, Rt glutes and piriformis  Electrical Stimulation Performed: No Treatment Response/Outcome: Utilized skilled palpation to identify trigger points.  During dry needling able to palpate muscle twitch and muscle elongation  Soft tissue mobilization performed to further promote tissue elongation and decreased pain.     Pin and stretch to right piriformis        07/11/2024: Nustep level 6 x5 min with PT present to discuss status Supine IT band stretch with strap 2x20 sec bilat Supine hip adduction stretch with strap 2x20 sec bilat Trigger Point Dry Needling  Initial Treatment: Pt instructed on Dry Needling rational, procedures, and possible side effects. Pt instructed to expect mild  to moderate muscle soreness later in the day and/or into the next day.  Pt instructed in methods to reduce muscle soreness. Pt instructed to continue prescribed HEP. Patient verbalized understanding of these instructions and education.    Patient Verbal Consent Given: Yes Education Handout Provided: Previously Provided Muscles Treated: Rt lateral quad, TFL, Rt glutes and piriformis  Electrical Stimulation Performed: No Treatment Response/Outcome: Utilized skilled palpation to identify trigger points.  During dry needling able to palpate muscle twitch and muscle elongation  Soft tissue mobilization performed to further promote tissue elongation and decreased pain.                                  PATIENT EDUCATION:  Education details: [email or web address] Person educated: Patient Education method: Programmer, multimedia, Facilities manager, Verbal cues, and Handouts Personnel officer) Education comprehension: verbalized understanding and returned demonstration  HOME EXERCISE PROGRAM: Access Code: [email or web address] URL: [email or web address] Date: 07/15/2024 Prepared by: Kristeen Sar  Exercises - Seated Lateral Trunk Stretch on Swiss Ball  - 1 x daily - 7 x weekly - 1 sets - 3 reps - 10-30 sec hold - Standing Hip Flexor Stretch With Overhead Reach and Contralateral Sidebend  - 1 x daily - 7 x weekly - 1 sets - 3 reps - 10-30 sec hold - Clamshell  - 1 x daily - 7 x weekly - 2 sets - 10 reps - Supine Figure 4 Piriformis Stretch  - 1 x daily - 7 x weekly - 2 reps - 20 sec hold - Supine ITB Stretch with Strap  - 1 x daily - 7 x weekly - 2 reps - 20 sec hold - Supine Dead Bug with Leg Extension  - 1 x daily - 7 x weekly - 2 sets - 10 reps - Static Prone on Elbows  - 1 x daily - 7 x weekly - 1-2 reps - 3-5 min hold - Prone Alternating Arm and Leg Lifts  - 1 x daily - 7 x weekly - 2 sets - 10 reps - Single Leg Bridge  - 1 x daily - 7 x weekly - 1-2 sets - 10 reps - Side Plank with Clam and Resistance  - 1 x daily - 7 x weekly - 1 sets - 10 reps - Single Leg Sit to Stand with Arms Crossed  - 1 x daily - 7 x weekly - 1 sets - 10 reps ASSESSMENT:  CLINICAL IMPRESSION: [name] presents to skilled therapy with no hip pain. She felt great after dry needling  and did not have as much soreness compared to last time. Treatment session focused on core and hip stability and strength. She was challenging with dynamic exercises required hip and core stability. With scar tissue massage noted on increased restrictions on Rt compared to Lt. Overall, patient is tolerated treatment session well and should continue to respond well with skilled therapy. Patient will benefit from skilled PT to address the below impairments and improve overall function.    OBJECTIVE IMPAIRMENTS: decreased knowledge of condition, decreased mobility, decreased ROM, decreased strength, increased fascial restrictions, increased muscle spasms, impaired flexibility, impaired sensation, improper body mechanics, postural dysfunction, and pain.   ACTIVITY LIMITATIONS: sitting, squatting, and stairs  PARTICIPATION LIMITATIONS: driving, community activity, and occupation  PERSONAL FACTORS: Time since onset of injury/illness/exacerbation are also affecting patient's functional outcome.   REHAB POTENTIAL: Excellent  CLINICAL DECISION MAKING: Evolving/moderate complexity  EVALUATION COMPLEXITY: Moderate   GOALS: Goals reviewed with  patient? Yes  SHORT TERM GOALS: Target date: 07/25/24  Pt will be ind with initial HEP Baseline: Goal status: MET 07/17/2024  2.  Pt will report reduced pain with sitting for work by at least 25% Baseline:  Goal status: INITIAL  3.  Pt will be able to achieve stretching positions for Rt hip flexor and quad without pain Baseline:  Goal status: INITIAL  4.  Pt will be able to demo symmetrical pelvis with 1/2 squat Baseline:  Goal status: INITIAL    LONG TERM GOALS: Target date: 08/22/24  Pt will be ind with advanced HEP Baseline:  Goal status: INITIAL  2.  Pt will be able to demo symmetrical pelvic control with full squat Baseline:  Goal status: INITIAL  3.  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  Pt will improve ODI to </= 20% to reduce disability level from moderate to min on ODI scale. Baseline: 13/50 = 26% Goal status: INITIAL  5.  Pt will report overall pain not to exceed 3/10 most days of the week. Baseline:  Goal status: INITIAL    PLAN:  PT FREQUENCY: 1-2x/week  PT DURATION: 8 weeks  PLANNED INTERVENTIONS: 97110-Therapeutic exercises, 97530- Therapeutic activity, 97112- Neuromuscular re-education, 97535- Self Care, 02859- Manual therapy, G0283- Electrical stimulation (unattended), [phone]- Electrical stimulation (manual), [id number]- Traction (mechanical), [id number]- Ionotophoresis 4mg /ml Dexamethasone , 79439 (1-2 muscles), 20561 (3+ muscles)- Dry Needling, Patient/Family education, Stair training, Taping, Joint mobilization, Spinal mobilization, Scar mobilization, Cryotherapy, and Moist heat.  PLAN FOR NEXT SESSION: proximal strengthening; strength for abd/ER/ext, functional body mechanics training working on symmetrical pelvis in squat; scar massage as needed    Kristeen Sar, PT 07/17/24 4:29 PM [facility] [address], [address] [place], [place] [address] Phone # [phone] Fax [phone]

## 2024-07-22 ENCOUNTER — Encounter: Payer: Self-pay | Admitting: Physical Therapy

## 2024-07-22 ENCOUNTER — Ambulatory Visit: Admitting: Physical Therapy

## 2024-07-22 DIAGNOSIS — M6281 Muscle weakness (generalized): Secondary | ICD-10-CM | POA: Diagnosis not present

## 2024-07-22 DIAGNOSIS — R252 Cramp and spasm: Secondary | ICD-10-CM | POA: Diagnosis not present

## 2024-07-22 DIAGNOSIS — M25551 Pain in right hip: Secondary | ICD-10-CM

## 2024-07-22 NOTE — Therapy (Signed)
 OUTPATIENT PHYSICAL THERAPY TREATMENT NOTE   Patient Name: [name] MRN: [id number] DOB:1976/04/18, 48 y.o., female Today's Date: 07/22/2024  END OF SESSION:  PT End of Session - 07/22/24 1504     Visit Number 6    Date for Recertification  08/22/24    Authorization Type [employer] (DN signed 9/12)    PT Start Time 1402    PT Stop Time 1459    PT Time Calculation (min) 57 min    Activity Tolerance Patient tolerated treatment well    Behavior During Therapy WFL for tasks assessed/performed              Past Medical History:  Diagnosis Date   Abnormal uterine bleeding (AUB)    Allergy    Anemia    Anxiety    Arthritis    FEET   Asthma    Chronic kidney disease    Depression    Environmental allergies    GERD (gastroesophageal reflux disease)    Heavy menstrual bleeding    History of pregnancy induced hypertension    Hypertension    Wears contact lenses    Past Surgical History:  Procedure Laterality Date   ABDOMINOPLASTY  05/2020   per PT THEN HAD REVISION 04/ 2022   AUGMENTATION MAMMAPLASTY Bilateral 06/05/2023   CESAREAN SECTION     11-30-2005;    @[facility]  by dr darcel  10-28-2007   COMBINED AUGMENTATION MAMMAPLASTY AND ABDOMINOPLASTY  06/05/2023   Also had liposuction   DILITATION & CURRETTAGE/HYSTROSCOPY WITH NOVASURE ABLATION N/A 10/04/2022   Procedure: DILATATION & CURETTAGE/HYSTEROSCOPY WITH NOVASURE ABLATION;  Surgeon: Linda Moccasin, MD;  Location: [facility];  Service: Gynecology;  Laterality: N/A;   NASAL SINUS SURGERY  1995   Patient Active Problem List   Diagnosis Date Noted   Lichen sclerosus 06/02/2024   S/P endometrial ablation 06/02/2024   History of abdominoplasty 06/02/2024   History of heavy vaginal bleeding 10/04/2022   Vitamin D  deficiency 05/14/2022    PCP:   REFERRING PROVIDER: Cleotilde Ronal RAMAN, MD  REFERRING DIAG: [id number] (ICD-10-CM) - History of abdominoplasty  Rationale for Evaluation and Treatment:  Rehabilitation  THERAPY DIAG:  Cramp and spasm  Pain in right hip  Muscle weakness (generalized)  ONSET DATE: Aug 2024 revision abdominoplasty with augmentation mammaplasty, initial abdominoplasty in 2021  SUBJECTIVE:                                                                                                                                                                                           SUBJECTIVE STATEMENT: Patient reports she is doing a lot better. She feels therapy  is helping her problem and she has not had any pain.   PERTINENT HISTORY:  Initial abdomoniplasty in 2021 to repair rectus diastasis/hernia which then failed and needed revision in 2024.  My Rt hip is a mess - better now than it has been.  I did a ton of pure barre 18mos ago and it got really bad Had big babies - 10 and 11lb Gets kneecap pain - especially descending stairs Started HRT about 30 days ago  PAIN:  PAIN:  Are you having pain? Yes NPRS scale: 3/10 Pain location: anterior abdomen, Rt hip, Rt kneecap Pain orientation: Right, anterior and across lower abdomen PAIN TYPE: aching, burning, and tight Pain description: intermittent and constant  Aggravating factors: sitting Relieving factors: stretching, moving   PRECAUTIONS: None  RED FLAGS: None   WEIGHT BEARING RESTRICTIONS: No  FALLS:  Has patient fallen in last 6 months? 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  DIAGNOSTIC FINDINGS:    PATIENT SURVEYS:   Modified Oswestry:  MODIFIED OSWESTRY DISABILITY SCALE  Date: 06/27/24 Score  Pain intensity 1 = The pain is bad, but I can manage without having to take (1) I can stand as long as I want but, it increases my pain. pain medication.  2. Personal care (washing, dressing, etc.) 0 =  I can take care of myself normally without causing increased pain.  3. Lifting 0 = I can lift heavy weights without increased pain.  4. Walking 3 =  Pain prevents me from walking more than  mile.  5. Sitting 2 =  Pain prevents me from sitting more than 1 hour.  6. Standing 2 =  Pain prevents me from standing more than 1 hour  7. Sleeping 0 = Pain does not prevent me from sleeping well.  8. Social Life 1 =  My social life is normal, but it increases my level of pain.  9. Traveling 2 =  My pain restricts my travel over 2 hours.  10. Employment/ Homemaking 2 = I can perform most of my homemaking/job duties, but pain prevents me from performing more physically stressful activities (eg, lifting, vacuuming).  Total 13/50, 26%   Interpretation of scores: Score Category Description  0-20% Minimal Disability The patient can cope with most living activities. Usually no treatment is indicated apart from advice on lifting, sitting and exercise  21-40% Moderate Disability The patient experiences more pain and difficulty with sitting, lifting and standing. Travel and social life are more difficult and they may be disabled from work. Personal care, sexual activity and sleeping are not grossly affected, and the patient can usually be managed by conservative means  41-60% Severe Disability Pain remains the main problem in this group, but activities of daily living are affected. These patients require a detailed investigation  61-80% Crippled Back pain impinges on all aspects of the patient's life. Positive intervention is required  81-100% Bed-bound  These patients are either bed-bound or exaggerating their symptoms  [name],  [name], et al. Surgery  versus conservative management of stable thoracolumbar fracture: the PRESTO feasibility RCT. [place] ([place]): [employer]; 2021 Nov. [facility] Technology Assessment, No. 25.62.) Appendix 3, Oswestry Disability Index category descriptors. Available from: FindJewel[employer].cz  Minimally Clinically Important Difference (MCID) = 12.8%  COGNITION: Overall cognitive status: Within functional limits for tasks assessed     SENSATION: Will sometimes get numbness on Rt hip  MUSCLE LENGTH: Limited ITB bil  Limited hip flexor and quad on Rt side - very tight and painful when assessing Limited adductors on Rt side  POSTURE: right pelvic obliquity, Rt ilial outflare  PALPATION: Muscle spasms with trigger points present: Rt ITB/lateral quad, iliacus, psoas, gluteals, piriformis, adductors Rt patella limited mobility with crepitus, tender bil distal ITB  LUMBAR ROM:   AROM eval  Flexion Fingers to toes no pain  Extension End range limited with Pulling through abdominal wall  Right lateral flexion Contralateral trunk tightness, pain in Rt hip  Left lateral flexion Contralateral trunk tightness  Right rotation 50% no pain  Left rotation 50% no pain   (Blank rows = not tested)  LOWER EXTREMITY ROM:    Rt hip hypermobile in IR/ER Rt hip Pain into FADIR and FABER Rt anterior hip and thigh pulling and pain with hip extension, limited by pain 50%  LOWER EXTREMITY MMT:   Rt hip abd and ER 4/5, all other muscle groups 5/5 bil LE  LUMBAR SPECIAL TESTS:  + Rt FADIR AND FABER Rt hip hypermobile for both IR/ER + Ober's test on Rt  FUNCTIONAL TESTS:  Significant pelvic rotation in squat with drop of Rt hemipelvis Increased pronation of Rt foot  GAIT: Distance walked:  Assistive device utilized: None Level of assistance: Complete Independence Comments: no gait deviations  TREATMENT DATE:  07/22/2024: Recumbent Bike  Level 2 5 mins- PT present to discuss status 90/90 toe tap holding 7# DB 2 x 20 Bridge x 10 SL bridge x 12 bilateral  Pallof press at cable column 10# x 15 each direction Single arm row + march with cable column x 15 bilateral  Trigger Point Dry Needling  Subsequent Treatment: Instructions provided previously at initial dry needling treatment.   Patient Verbal Consent Given: Yes Education Handout Provided: Previously Provided Muscles Treated: Rt quads, hip flexor, gastroc, glutes Electrical Stimulation Performed: No Treatment Response/Outcome: Utilized skilled palpation to identify trigger points.  During dry needling able to palpate muscle twitch and muscle elongation  Soft tissue mobilization performed to further promote tissue elongation and decreased pain.        07/17/2024: Nustep level 6 x5 min with PT present to discuss status Iso hip abduction with purple ball Bridge with hip adduction with purple ball x 12 SL bridge x 12 bilateral  Iso bridge hold x 1 min Hooklying alt hand and knee press with purple ball x 10 bilateral  90/90 heel tap 2 x 20 Half kneeling chop with blue TB x 15 bilateral  Plank off plinth + march x 20 Around the world's 15# KB x 10 each direction 15# KB at shoulder height  march x 12 each side Manual: scar tissue massage with silicon cup for improved tissue mobility and decreased restriction     07/15/2024: Nustep level 7 x5 min with PT present to discuss status Cat Cow x 10  Child's Pose x 45sec  Prone hip IR/ER x 10 SL bridge x 10 bilateral (harder on left)  Sidelying double clam x 10  Trigger Point Dry Needling  Subsequent Treatment: Instructions provided previously at  initial dry needling treatment.   Patient Verbal Consent Given: Yes Education Handout Provided: Previously Provided Muscles Treated: Rt lateral quad, TFL, Rt glutes and piriformis  Electrical Stimulation Performed: No Treatment Response/Outcome: Utilized skilled palpation  to identify trigger points.  During dry needling able to palpate muscle twitch and muscle elongation  Soft tissue mobilization performed to further promote tissue elongation and decreased pain.     Pin and stretch to right piriformis        07/11/2024: Nustep level 6 x5 min with PT present to discuss status Supine IT band stretch with strap 2x20 sec bilat Supine hip adduction stretch with strap 2x20 sec bilat Trigger Point Dry Needling  Initial Treatment: Pt instructed on Dry Needling rational, procedures, and possible side effects. Pt instructed to expect mild to moderate muscle soreness later in the day and/or into the next day.  Pt instructed in methods to reduce muscle soreness. Pt instructed to continue prescribed HEP. Patient verbalized understanding of these instructions and education.   Patient Verbal Consent Given: Yes Education Handout Provided: Previously Provided Muscles Treated: Rt lateral quad, TFL, Rt glutes and piriformis  Electrical Stimulation Performed: No Treatment Response/Outcome: Utilized skilled palpation to identify trigger points.  During dry needling able to palpate muscle twitch and muscle elongation  Soft tissue mobilization performed to further promote tissue elongation and decreased pain.                                  PATIENT EDUCATION:  Education details: [email or web address] Person educated: Patient Education method: Programmer, multimedia, Facilities manager, Verbal cues, and Handouts Personnel officer) Education comprehension: verbalized understanding and returned demonstration  HOME EXERCISE PROGRAM: Access Code: [email or web address] URL: [email or web address] Date: 07/15/2024 Prepared by: Kristeen Sar  Exercises - Seated Lateral Trunk Stretch on Swiss Ball  - 1 x daily - 7 x weekly - 1 sets - 3 reps - 10-30 sec hold - Standing Hip Flexor Stretch With Overhead Reach and Contralateral Sidebend  - 1 x daily - 7 x weekly - 1 sets - 3 reps - 10-30 sec hold - Clamshell  - 1 x  daily - 7 x weekly - 2 sets - 10 reps - Supine Figure 4 Piriformis Stretch  - 1 x daily - 7 x weekly - 2 reps - 20 sec hold - Supine ITB Stretch with Strap  - 1 x daily - 7 x weekly - 2 reps - 20 sec hold - Supine Dead Bug with Leg Extension  - 1 x daily - 7 x weekly - 2 sets - 10 reps - Static Prone on Elbows  - 1 x daily - 7 x weekly - 1-2 reps - 3-5 min hold - Prone Alternating Arm and Leg Lifts  - 1 x daily - 7 x weekly - 2 sets - 10 reps - Single Leg Bridge  - 1 x daily - 7 x weekly - 1-2 sets - 10 reps - Side Plank with Clam and Resistance  - 1 x daily - 7 x weekly - 1 sets - 10 reps - Single Leg Sit to Stand with Arms Crossed  - 1 x daily - 7 x weekly - 1 sets - 10 reps ASSESSMENT:  CLINICAL IMPRESSION: [name] is responding very well with skilled therapy. She reports her wrapping sensation pain and tingling in her leg has almost completely resolved. She is tolerating current exercise intensity well. She feels very weak in her core and hips.  Educated patient on the importance of having a strong core as a foundation for all functional movements. Dry needling was successful at eliciting muscle twitch and elongation of all target areas. Patient verbalized some soreness after technique. Patient will benefit from skilled PT to address the below impairments and improve overall function.     OBJECTIVE IMPAIRMENTS: decreased knowledge of condition, decreased mobility, decreased ROM, decreased strength, increased fascial restrictions, increased muscle spasms, impaired flexibility, impaired sensation, improper body mechanics, postural dysfunction, and pain.   ACTIVITY LIMITATIONS: sitting, squatting, and stairs  PARTICIPATION LIMITATIONS: driving, community activity, and occupation  PERSONAL FACTORS: Time since onset of injury/illness/exacerbation are also affecting patient's functional outcome.   REHAB POTENTIAL: Excellent  CLINICAL DECISION MAKING: Evolving/moderate  complexity  EVALUATION COMPLEXITY: Moderate   GOALS: Goals reviewed with patient? Yes  SHORT TERM GOALS: Target date: 07/25/24  Pt will be ind with initial HEP Baseline: Goal status: MET 07/17/2024  2.  Pt will report reduced pain with sitting for work by at least 25% Baseline:  Goal status: INITIAL  3.  Pt will be able to achieve stretching positions for Rt hip flexor and quad without pain Baseline:  Goal status: INITIAL  4.  Pt will be able to demo symmetrical pelvis with 1/2 squat Baseline:  Goal status: INITIAL    LONG TERM GOALS: Target date: 08/22/24  Pt will be ind with advanced HEP Baseline:  Goal status: INITIAL  2.  Pt will be able to demo symmetrical pelvic control with full squat Baseline:  Goal status: INITIAL  3.  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  Pt will improve ODI to </= 20% to reduce disability level from moderate to min on ODI scale. Baseline: 13/50 = 26% Goal status: INITIAL  5.  Pt will report overall pain not to exceed 3/10 most days of the week. Baseline:  Goal status: INITIAL    PLAN:  PT FREQUENCY: 1-2x/week  PT DURATION: 8 weeks  PLANNED INTERVENTIONS: 97110-Therapeutic exercises, 97530- Therapeutic activity, 97112- Neuromuscular re-education, 97535- Self Care, 02859- Manual therapy, G0283- Electrical stimulation (unattended), [phone]- Electrical stimulation (manual), [id number]- Traction (mechanical), [id number]- Ionotophoresis 4mg /ml Dexamethasone , 79439 (1-2 muscles), 20561 (3+ muscles)- Dry Needling, Patient/Family education, Stair training, Taping, Joint mobilization, Spinal mobilization, Scar mobilization, Cryotherapy, and Moist heat.  PLAN FOR NEXT SESSION: assess response to DN; ecc step downs; proximal strengthening; strength for abd/ER/ext, functional body mechanics training working on symmetrical pelvis in squat; scar massage  as needed    Kristeen Sar, PT 07/22/24 3:05 PM [facility] [address], [address] [place], [place] [address] Phone # [phone] Fax [phone]

## 2024-07-25 ENCOUNTER — Ambulatory Visit: Admitting: Physical Therapy

## 2024-07-25 ENCOUNTER — Encounter: Payer: Self-pay | Admitting: Physical Therapy

## 2024-07-25 DIAGNOSIS — M25551 Pain in right hip: Secondary | ICD-10-CM

## 2024-07-25 DIAGNOSIS — M6281 Muscle weakness (generalized): Secondary | ICD-10-CM | POA: Diagnosis not present

## 2024-07-25 DIAGNOSIS — R252 Cramp and spasm: Secondary | ICD-10-CM

## 2024-07-25 NOTE — Therapy (Signed)
 OUTPATIENT PHYSICAL THERAPY TREATMENT NOTE   Patient Name: [name] MRN: [id number] DOB:May 12, 1976, 48 y.o., female Today's Date: 07/25/2024  END OF SESSION:  PT End of Session - 07/25/24 0928     Visit Number 7    Date for Recertification  08/22/24    Authorization Type BC[employer] (DN signed 9/12)    PT Start Time 0850    PT Stop Time 0922    PT Time Calculation (min) 32 min    Activity Tolerance Patient tolerated treatment well    Behavior During Therapy [facility] for tasks assessed/performed               Past Medical History:  Diagnosis Date   Abnormal uterine bleeding (AUB)    Allergy    Anemia    Anxiety    Arthritis    FEET   Asthma    Chronic kidney disease    Depression    Environmental allergies    GERD (gastroesophageal reflux disease)    Heavy menstrual bleeding    History of pregnancy induced hypertension    Hypertension    Wears contact lenses    Past Surgical History:  Procedure Laterality Date   ABDOMINOPLASTY  05/2020   per PT THEN HAD REVISION 04/ 2022   AUGMENTATION MAMMAPLASTY Bilateral 06/05/2023   CESAREAN SECTION     11-30-2005;    @[facility]  by dr darcel  10-28-2007   COMBINED AUGMENTATION MAMMAPLASTY AND ABDOMINOPLASTY  06/05/2023   Also had liposuction   DILITATION & CURRETTAGE/HYSTROSCOPY WITH NOVASURE ABLATION N/A 10/04/2022   Procedure: DILATATION & CURETTAGE/HYSTEROSCOPY WITH NOVASURE ABLATION;  Surgeon: Cleatus Moccasin, MD;  Location: [facility];  Service: Gynecology;  Laterality: N/A;   NASAL SINUS SURGERY  1995   Patient Active Problem List   Diagnosis Date Noted   Lichen sclerosus 06/02/2024   S/P endometrial ablation 06/02/2024   History of abdominoplasty 06/02/2024   History of heavy vaginal bleeding 10/04/2022   Vitamin D  deficiency 05/14/2022    PCP:   REFERRING PROVIDER: Cleotilde Ronal RAMAN, MD  REFERRING DIAG: [id number] (ICD-10-CM) - History of abdominoplasty  Rationale for Evaluation and Treatment:  Rehabilitation  THERAPY DIAG:  Cramp and spasm  Pain in right hip  Muscle weakness (generalized)  ONSET DATE: Aug 2024 revision abdominoplasty with augmentation mammaplasty, initial abdominoplasty in 2021  SUBJECTIVE:                                                                                                                                                                                           SUBJECTIVE STATEMENT: Patient reports she is doing good today. She was sore  after dry needling, but she is not currently having any pain.  PERTINENT HISTORY:  Initial abdomoniplasty in 2021 to repair rectus diastasis/hernia which then failed and needed revision in 2024.  My Rt hip is a mess - better now than it has been.  I did a ton of pure barre 18mos ago and it got really bad Had big babies - 10 and 11lb Gets kneecap pain - especially descending stairs Started HRT about 30 days ago  PAIN:  PAIN:  Are you having pain? Yes NPRS scale: 3/10 Pain location: anterior abdomen, Rt hip, Rt kneecap Pain orientation: Right, anterior and across lower abdomen PAIN TYPE: aching, burning, and tight Pain description: intermittent and constant  Aggravating factors: sitting Relieving factors: stretching, moving   PRECAUTIONS: None  RED FLAGS: None   WEIGHT BEARING RESTRICTIONS: No  FALLS:  Has patient fallen in last 6 months? 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  DIAGNOSTIC FINDINGS:    PATIENT SURVEYS:   Modified Oswestry:  MODIFIED OSWESTRY DISABILITY SCALE  Date: 06/27/24 Score  Pain intensity 1 = The pain is bad, but I can manage without having to take (1) I can stand as long as I want but, it increases my pain. pain medication.  2. Personal care (washing, dressing, etc.) 0 =  I can take care of myself normally without causing increased pain.  3. Lifting 0 = I can lift heavy weights without increased pain.  4. Walking 3 =  Pain prevents me from walking more than  mile.  5. Sitting 2 =  Pain prevents me from sitting more than 1 hour.  6. Standing 2 =  Pain prevents me from standing more than 1 hour  7. Sleeping 0 = Pain does not prevent me from sleeping well.  8. Social Life 1 =  My social life is normal, but it increases my level of pain.  9. Traveling 2 =  My pain restricts my travel over 2 hours.  10. Employment/ Homemaking 2 = I can perform most of my homemaking/job duties, but pain prevents me from performing more physically stressful activities (eg, lifting, vacuuming).  Total 13/50, 26%   Interpretation of scores: Score Category Description  0-20% Minimal Disability The patient can cope with most living activities. Usually no treatment is indicated apart from advice on lifting, sitting and exercise  21-40% Moderate Disability The patient experiences more pain and difficulty with sitting, lifting and standing. Travel and social life are more difficult and they may be disabled from work. Personal care, sexual activity and sleeping are not grossly affected, and the patient can usually be managed by conservative means  41-60% Severe Disability Pain remains the main problem in this group, but activities of daily living are affected. These patients require a detailed investigation  61-80% Crippled Back pain impinges on all aspects of the patient's life. Positive intervention is required  81-100% Bed-bound  These patients are either bed-bound or exaggerating their symptoms  Bluford BRAVO,  [name] [name] [name], et al. Surgery versus  conservative management of stable thoracolumbar fracture: the PRESTO feasibility RCT. [place]): [employer]; 2021 Nov. [facility] Technology Assessment, No. 25.62.) Appendix 3, Oswestry Disability Index category descriptors. Available from: FindJewelers.cz  Minimally Clinically Important Difference (MCID) = 12.8%  COGNITION: Overall cognitive status: Within functional limits for tasks assessed     SENSATION: Will sometimes get numbness on Rt hip  MUSCLE LENGTH: Limited ITB bil  Limited hip flexor and quad on Rt side - very tight and painful when assessing Limited adductors on Rt side  POSTURE: right pelvic obliquity, Rt ilial outflare  PALPATION: Muscle spasms with trigger points present: Rt ITB/lateral quad, iliacus, psoas, gluteals, piriformis, adductors Rt patella limited mobility with crepitus, tender bil distal ITB  LUMBAR ROM:   AROM eval  Flexion Fingers to toes no pain  Extension End range limited with Pulling through abdominal wall  Right lateral flexion Contralateral trunk tightness, pain in Rt hip  Left lateral flexion Contralateral trunk tightness  Right rotation 50% no pain  Left rotation 50% no pain   (Blank rows = not tested)  LOWER EXTREMITY ROM:    Rt hip hypermobile in IR/ER Rt hip Pain into FADIR and FABER Rt anterior hip and thigh pulling and pain with hip extension, limited by pain 50%  LOWER EXTREMITY MMT:   Rt hip abd and ER 4/5, all other muscle groups 5/5 bil LE  LUMBAR SPECIAL TESTS:  + Rt FADIR AND FABER Rt hip hypermobile for both IR/ER + Ober's test on Rt  FUNCTIONAL TESTS:  Significant pelvic rotation in squat with drop of Rt hemipelvis Increased pronation of Rt foot  GAIT: Distance walked:  Assistive device utilized: None Level of assistance: Complete Independence Comments: no gait deviations  TREATMENT DATE:  07/25/2024: Recumbent Bike  Level 4 5 mins- PT present to discuss status Squat assessment: slight right knee valgus at bottom when beginning ascent. Decreased right pelvic rotation compared to at equal Squat to plinth x 5 90/90 toe tap holding 10# DB  x 20 90/90 with one leg raising up and down x 15 each side holding 10# DB overhead Side plank + reach through x 12 each  Bird dog 2 x 10 Yoga block lift off x 10 bilateral  Fired hydrant x 10 bilateral (PT provided manual cue for proper form) Pallof press at cable column 10# x 10 each direction Patient requested to leave appointment early     07/22/2024: Recumbent Bike Level 2 5 mins- PT present to discuss status 90/90 toe tap holding 7# DB 2 x 20 Bridge x 10 SL bridge x 12 bilateral  Pallof press at cable column 10# x 15 each direction Single arm row + march with cable column x 15 bilateral  Trigger Point Dry Needling  Subsequent Treatment: Instructions provided previously at initial dry needling treatment.   Patient Verbal Consent Given: Yes Education Handout Provided: Previously Provided Muscles Treated: Rt quads, hip flexor, gastroc, glutes Electrical Stimulation Performed: No Treatment Response/Outcome: Utilized skilled palpation to identify trigger points.  During dry needling able to palpate muscle twitch and muscle elongation  Soft tissue mobilization performed to further promote tissue elongation and decreased pain.        07/17/2024: Nustep level 6 x5 min with PT present to discuss status Iso hip abduction with purple ball Bridge with hip adduction with purple ball x 12 SL bridge x 12 bilateral  Iso bridge hold x 1 min Hooklying alt hand and knee press with purple ball x 10 bilateral  90/90 heel tap 2 x 20 Half kneeling chop with blue TB x 15 bilateral  Plank off plinth + march x 20 Around the world's 15# KB x 10 each direction 15# KB at shoulder height  march x 12 each side Manual: scar tissue massage with silicon cup for improved tissue  mobility and decreased restriction     07/15/2024: Nustep level 7 x5 min with PT present to discuss status Cat Cow x 10  Child's Pose x 45sec  Prone hip IR/ER x 10 SL bridge x 10 bilateral (harder on left)  Sidelying double clam x 10  Trigger Point Dry Needling  Subsequent Treatment: Instructions provided previously at initial dry needling treatment.   Patient Verbal Consent Given: Yes Education Handout Provided: Previously Provided Muscles Treated: Rt lateral quad, TFL, Rt glutes and piriformis  Electrical Stimulation Performed: No Treatment Response/Outcome: Utilized skilled palpation to identify trigger points.  During dry needling able to palpate muscle twitch and muscle elongation  Soft tissue mobilization performed to further promote tissue elongation and decreased pain.     Pin and stretch to right piriformis                                   PATIENT EDUCATION:  Education details: [email or web address] Person educated: Patient Education method: Programmer, multimedia, Facilities manager, Verbal cues, and Handouts Personnel officer) Education comprehension: verbalized understanding and returned demonstration  HOME EXERCISE PROGRAM: Access Code: [email or web address] URL: [email or web address] Date: 07/25/2024 Prepared by: Kristeen Sar  Exercises - Seated Lateral Trunk Stretch on Swiss Ball  - 1 x daily - 7 x weekly - 1 sets - 3 reps - 10-30 sec hold - Standing Hip Flexor Stretch With Overhead Reach and Contralateral Sidebend  - 1 x daily - 7 x weekly - 1 sets - 3 reps - 10-30 sec hold - Clamshell  - 1 x daily - 7 x weekly - 2 sets - 10 reps - Supine Figure 4 Piriformis Stretch  - 1 x daily - 7 x weekly - 2 reps - 20 sec hold - Supine ITB Stretch with Strap  - 1 x daily - 7 x weekly - 2 reps - 20 sec hold - Supine Dead Bug with Leg Extension  - 1 x daily - 7 x weekly - 2 sets - 10 reps - Static Prone on Elbows  - 1 x daily - 7 x weekly - 1-2 reps - 3-5 min hold - Prone Alternating Arm and Leg Lifts  -  1 x daily - 7 x weekly - 2 sets - 10 reps - Single Leg Bridge  - 1 x daily - 7 x weekly - 1-2 sets - 10 reps - Side Plank with Clam and Resistance  - 1 x daily - 7 x weekly - 1 sets - 10 reps - Single Leg Sit to Stand with Arms Crossed  - 1 x daily - 7 x weekly - 1 sets - 10 reps - Quadruped Yoga Block Lift Off  - 1 x daily - 7 x weekly - 1-2 sets - 10-15 reps - Quadruped Fire Hydrant  - 1 x daily - 7 x weekly - 1-2 sets - 10 reps ASSESSMENT:  CLINICAL IMPRESSION: Gillian verbalized feeling 75% better since starting therapy. She is responded well to dry needling and hip and core strengthening exercises. With quadruped exercises noted increased muscle definition on left lumbar erector spinae compared to right. With fire hydrants PT provided  manual cue for proper alignment. Patient would frequently shift her weight into the stance leg. Re- assessed patient's squat mechanics and noted decreased right pelvic rotation compared to evaluation. Noted slight need valgus at end of movement. Overall, patient is progressing well with skilled therapy and is on track to meet goals. Patient will benefit from skilled PT to address the below impairments and improve overall function.     OBJECTIVE IMPAIRMENTS: decreased knowledge of condition, decreased mobility, decreased ROM, decreased strength, increased fascial restrictions, increased muscle spasms, impaired flexibility, impaired sensation, improper body mechanics, postural dysfunction, and pain.   ACTIVITY LIMITATIONS: sitting, squatting, and stairs  PARTICIPATION LIMITATIONS: driving, community activity, and occupation  PERSONAL FACTORS: Time since onset of injury/illness/exacerbation are also affecting patient's functional outcome.   REHAB POTENTIAL: Excellent  CLINICAL DECISION MAKING: Evolving/moderate complexity  EVALUATION COMPLEXITY: Moderate   GOALS: Goals reviewed with patient? Yes  SHORT TERM GOALS: Target date: 07/25/24  Pt will be ind  with initial HEP Baseline: Goal status: MET 07/17/2024  2.  Pt will report reduced pain with sitting for work by at least 25% Baseline:  Goal status: MET 07/25/2024  3.  Pt will be able to achieve stretching positions for Rt hip flexor and quad without pain Baseline:  Goal status: INITIAL  4.  Pt will be able to demo symmetrical pelvis with 1/2 squat Baseline:  Goal status: MET 07/25/2024    LONG TERM GOALS: Target date: 08/22/24  Pt will be ind with advanced HEP Baseline:  Goal status: INITIAL  2.  Pt will be able to demo symmetrical pelvic control with full squat Baseline:  Goal status: INITIAL  3.  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  Pt will improve ODI to </= 20% to reduce disability level from moderate to min on ODI scale. Baseline: 13/50 = 26% Goal status: INITIAL  5.  Pt will report overall pain not to exceed 3/10 most days of the week. Baseline:  Goal status: INITIAL    PLAN:  PT FREQUENCY: 1-2x/week  PT DURATION: 8 weeks  PLANNED INTERVENTIONS: 97110-Therapeutic exercises, 97530- Therapeutic activity, 97112- Neuromuscular re-education, 97535- Self Care, 02859- Manual therapy, G0283- Electrical stimulation (unattended), [phone]- Electrical stimulation (manual), [id number]- Traction (mechanical), [id number]- Ionotophoresis 4mg /ml Dexamethasone , 79439 (1-2 muscles), 20561 (3+ muscles)- Dry Needling, Patient/Family education, Stair training, Taping, Joint mobilization, Spinal mobilization, Scar mobilization, Cryotherapy, and Moist heat.  PLAN FOR NEXT SESSION: assess updated HEP; ecc step downs; proximal strengthening; strength for abd/ER/ext, functional body mechanics training working on symmetrical pelvis in squat; scar massage as needed    Kristeen Sar, PT 07/25/24 9:29 AM [facility] [address], [address] [place], [place]  [address] Phone # [phone] Fax [phone]

## 2024-08-01 ENCOUNTER — Ambulatory Visit: Attending: Obstetrics & Gynecology | Admitting: Physical Therapy

## 2024-08-01 ENCOUNTER — Encounter: Payer: Self-pay | Admitting: Physical Therapy

## 2024-08-01 DIAGNOSIS — M6281 Muscle weakness (generalized): Secondary | ICD-10-CM | POA: Diagnosis not present

## 2024-08-01 DIAGNOSIS — M25551 Pain in right hip: Secondary | ICD-10-CM | POA: Insufficient documentation

## 2024-08-01 DIAGNOSIS — Z23 Encounter for immunization: Secondary | ICD-10-CM | POA: Diagnosis not present

## 2024-08-01 DIAGNOSIS — R252 Cramp and spasm: Secondary | ICD-10-CM | POA: Diagnosis not present

## 2024-08-01 NOTE — Therapy (Signed)
 OUTPATIENT PHYSICAL THERAPY TREATMENT NOTE   Patient Name: [name] MRN: [id number] DOB:Oct 15, 1976, 48 y.o., female Today's Date: 08/01/2024  END OF SESSION:  PT End of Session - 08/01/24 1201     Visit Number 8    Date for Recertification  08/22/24    Authorization Type BCBS (DN signed 10/3)    PT Start Time 1103    PT Stop Time 1143    PT Time Calculation (min) 40 min    Activity Tolerance Patient tolerated treatment well    Behavior During Therapy WFL for tasks assessed/performed                Past Medical History:  Diagnosis Date   Abnormal uterine bleeding (AUB)    Allergy    Anemia    Anxiety    Arthritis    FEET   Asthma    Chronic kidney disease    Depression    Environmental allergies    GERD (gastroesophageal reflux disease)    Heavy menstrual bleeding    History of pregnancy induced hypertension    Hypertension    Wears contact lenses    Past Surgical History:  Procedure Laterality Date   ABDOMINOPLASTY  05/2020   per PT THEN HAD REVISION 04/ 2022   AUGMENTATION MAMMAPLASTY Bilateral 06/05/2023   CESAREAN SECTION     11-30-2005;    @[facility]  by dr darcel  10-28-2007   COMBINED AUGMENTATION MAMMAPLASTY AND ABDOMINOPLASTY  06/05/2023   Also had liposuction   DILITATION & CURRETTAGE/HYSTROSCOPY WITH NOVASURE ABLATION N/A 10/04/2022   Procedure: DILATATION & CURETTAGE/HYSTEROSCOPY WITH NOVASURE ABLATION;  Surgeon: Cleatus Moccasin, MD;  Location: [facility] [place];  Service: Gynecology;  Laterality: N/A;   NASAL SINUS SURGERY  1995   Patient Active Problem List   Diagnosis Date Noted   Lichen sclerosus 06/02/2024   S/P endometrial ablation 06/02/2024   History of abdominoplasty 06/02/2024   History of heavy vaginal bleeding 10/04/2022   Vitamin D  deficiency 05/14/2022    PCP:   REFERRING PROVIDER: Cleotilde Ronal RAMAN, MD  REFERRING DIAG: [phone] (ICD-10-CM) - History of abdominoplasty  Rationale for Evaluation and Treatment:  Rehabilitation  THERAPY DIAG:  Cramp and spasm  Pain in right hip  Muscle weakness (generalized)  ONSET DATE: Aug 2024 revision abdominoplasty with augmentation mammaplasty, initial abdominoplasty in 2021  SUBJECTIVE:                                                                                                                                                                                           SUBJECTIVE STATEMENT: Patient reports she is a little painful today. She  is has had mild  burning sensation in her left hip. Pain is 3/10.  PERTINENT HISTORY:  Initial abdomoniplasty in 2021 to repair rectus diastasis/hernia which then failed and needed revision in 2024.  My Rt hip is a mess - better now than it has been.  I did a ton of pure barre 18mos ago and it got really bad Had big babies - 10 and 11lb Gets kneecap pain - especially descending stairs Started HRT about 30 days ago  PAIN:  PAIN:  Are you having pain? Yes NPRS scale: 3/10 Pain location: anterior abdomen, Rt hip, Rt kneecap Pain orientation: Right, anterior and across lower abdomen PAIN TYPE: aching, burning, and tight Pain description: intermittent and constant  Aggravating factors: sitting Relieving factors: stretching, moving   PRECAUTIONS: None  RED FLAGS: None   WEIGHT BEARING RESTRICTIONS: No  FALLS:  Has patient fallen in last 6 months? 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  DIAGNOSTIC FINDINGS:    PATIENT  SURVEYS:  Modified Oswestry:  MODIFIED OSWESTRY DISABILITY SCALE  Date: 06/27/24 Score  Pain intensity 1 = The pain is bad, but I can manage without having to take (1) I can stand as long as I want but, it increases my pain. pain medication.  2. Personal care (washing, dressing, etc.) 0 =  I can take care of myself normally without causing increased pain.  3. Lifting 0 = I can lift heavy weights without increased pain.  4. Walking 3 =  Pain prevents me from walking more than  mile.  5. Sitting 2 =  Pain prevents me from sitting more than 1 hour.  6. Standing 2 =  Pain prevents me from standing more than 1 hour  7. Sleeping 0 = Pain does not prevent me from sleeping well.  8. Social Life 1 =  My social life is normal, but it increases my level of pain.  9. Traveling 2 =  My pain restricts my travel over 2 hours.  10. Employment/ Homemaking 2 = I can perform most of my homemaking/job duties, but pain prevents me from performing more physically stressful activities (eg, lifting, vacuuming).  Total 13/50, 26%   Interpretation of scores: Score Category Description  0-20% Minimal Disability The patient can cope with most living activities. Usually no treatment is indicated apart from advice on lifting, sitting and exercise  21-40% Moderate Disability The patient experiences more pain and difficulty with sitting, lifting and standing. Travel and social life are more difficult and they may be disabled from work. Personal care, sexual activity and sleeping are not grossly affected, and the patient can usually be managed by conservative means  41-60% Severe Disability Pain remains the main problem in this group, but activities of daily living are affected. These patients require a detailed investigation  61-80% Crippled Back pain impinges on all aspects of the patient's life. Positive intervention is required  81-100% Bed-bound  These patients are either bed-bound or exaggerating their symptoms  Bluford BRAVO,  [name] [name] [name], et  al. Surgery versus conservative management of stable thoracolumbar fracture: the PRESTO feasibility RCT. [place]): [employer]; 2021 Nov. [facility] Technology Assessment, No. 25.62.) Appendix 3, Oswestry Disability Index category descriptors. Available from: FindJewelers.cz  Minimally Clinically Important Difference (MCID) = 12.8%  COGNITION: Overall cognitive status: Within functional limits for tasks assessed     SENSATION: Will sometimes get numbness on Rt hip  MUSCLE LENGTH: Limited ITB bil  Limited hip flexor and quad on Rt side - very tight and painful when assessing Limited adductors on Rt side  POSTURE: right pelvic obliquity, Rt ilial outflare  PALPATION: Muscle spasms with trigger points present: Rt ITB/lateral quad, iliacus, psoas, gluteals, piriformis, adductors Rt patella limited mobility with crepitus, tender bil distal ITB  LUMBAR ROM:   AROM eval  Flexion Fingers to toes no pain  Extension End range limited with Pulling through abdominal wall  Right lateral flexion Contralateral trunk tightness, pain in Rt hip  Left lateral flexion Contralateral trunk tightness  Right rotation 50% no pain  Left rotation 50% no pain   (Blank rows = not tested)  LOWER EXTREMITY ROM:    Rt hip hypermobile in IR/ER Rt hip Pain into FADIR and FABER Rt anterior hip and thigh pulling and pain with hip extension, limited by pain 50%  LOWER EXTREMITY MMT:   Rt hip abd and ER 4/5, all other muscle groups 5/5 bil LE  LUMBAR SPECIAL TESTS:  + Rt FADIR AND FABER Rt hip hypermobile for both IR/ER + Ober's test on Rt  FUNCTIONAL TESTS:  Significant pelvic rotation in squat with drop of Rt hemipelvis Increased pronation of Rt foot  GAIT: Distance walked:  Assistive device utilized: None Level of assistance: Complete Independence Comments: no gait deviations  TREATMENT DATE:  08/01/2024: Recumbent Bike  Level 4 4 mins- PT present to discuss status Trigger Point Dry Needling  Subsequent Treatment: Instructions provided previously at initial dry needling treatment.   Patient Verbal Consent Given: Yes Education Handout Provided: Previously Provided Muscles Treated: Left lateral quad, Lt hip flexor, Lt glute & piriformis  Electrical Stimulation Performed: No Treatment Response/Outcome: Utilized skilled palpation to identify trigger points.  During dry needling able to palpate muscle twitch and muscle elongation   Piriformis stretch on plinth + lateral trunk bend 2 x 20 sec bilateral      07/25/2024: Recumbent Bike Level 4 5 mins- PT present to discuss status Squat assessment: slight right knee valgus at bottom when beginning ascent. Decreased right pelvic rotation compared to at equal Squat to plinth x 5 90/90 toe tap holding 10# DB  x 20 90/90 with one leg raising up and down x 15 each side holding 10# DB overhead Side plank + reach through x 12 each  Bird dog 2 x 10 Yoga block lift off x 10 bilateral  Fired hydrant x 10 bilateral (PT provided manual cue for proper form) Pallof press at cable column 10# x 10 each direction Patient requested to leave appointment early     07/22/2024: Recumbent Bike Level 2 5 mins- PT present to discuss status 90/90 toe tap holding 7# DB 2 x 20 Bridge x 10 SL bridge x 12 bilateral  Pallof press at cable column 10# x 15 each direction Single arm row + march with cable column x 15 bilateral  Trigger Point Dry Needling  Subsequent Treatment: Instructions provided previously at initial dry needling treatment.   Patient Verbal Consent Given: Yes Education Handout Provided: Previously Provided Muscles Treated: Rt quads, hip  flexor, gastroc, glutes Electrical Stimulation Performed: No Treatment Response/Outcome: Utilized skilled palpation to identify trigger points.  During dry needling able to palpate muscle twitch and muscle elongation  Soft  tissue mobilization performed to further promote tissue elongation and decreased pain.        07/17/2024: Nustep level 6 x5 min with PT present to discuss status Iso hip abduction with purple ball Bridge with hip adduction with purple ball x 12 SL bridge x 12 bilateral  Iso bridge hold x 1 min Hooklying alt hand and knee press with purple ball x 10 bilateral  90/90 heel tap 2 x 20 Half kneeling chop with blue TB x 15 bilateral  Plank off plinth + march x 20 Around the world's 15# KB x 10 each direction 15# KB at shoulder height  march x 12 each side Manual: scar tissue massage with silicon cup for improved tissue mobility and decreased restriction                                 PATIENT EDUCATION:  Education details: [email or web address] Person educated: Patient Education method: Programmer, multimedia, Facilities manager, Verbal cues, and Handouts Personnel officer) Education comprehension: verbalized understanding and returned demonstration  HOME EXERCISE PROGRAM: Access Code: [email or web address] URL: [email or web address] Date: 07/25/2024 Prepared by: Kristeen Sar  Exercises - Seated Lateral Trunk Stretch on Swiss Ball  - 1 x daily - 7 x weekly - 1 sets - 3 reps - 10-30 sec hold - Standing Hip Flexor Stretch With Overhead Reach and Contralateral Sidebend  - 1 x daily - 7 x weekly - 1 sets - 3 reps - 10-30 sec hold - Clamshell  - 1 x daily - 7 x weekly - 2 sets - 10 reps - Supine Figure 4 Piriformis Stretch  - 1 x daily - 7 x weekly - 2 reps - 20 sec hold - Supine ITB Stretch with Strap  - 1 x daily - 7 x weekly - 2 reps - 20 sec hold - Supine Dead Bug with Leg Extension  - 1 x daily - 7 x weekly - 2 sets - 10 reps - Static Prone on Elbows  - 1 x daily - 7 x weekly - 1-2 reps - 3-5 min hold - Prone Alternating Arm and Leg Lifts  - 1 x daily - 7 x weekly - 2 sets - 10 reps - Single Leg Bridge  - 1 x daily - 7 x weekly - 1-2 sets - 10 reps - Side Plank with Clam and Resistance  - 1 x daily - 7 x weekly - 1  sets - 10 reps - Single Leg Sit to Stand with Arms Crossed  - 1 x daily - 7 x weekly - 1 sets - 10 reps - Quadruped Yoga Block Lift Off  - 1 x daily - 7 x weekly - 1-2 sets - 10-15 reps - Quadruped Fire Hydrant  - 1 x daily - 7 x weekly - 1-2 sets - 10 reps ASSESSMENT:  CLINICAL IMPRESSION: Lluvia presents with a little more pain today. She has had the burning sensation in her anterior hip this morning. Patient had increased trigger points in her lateral quad and glute and she verbalized feeling a release after technique perform. Discussed being more intentional about incorporating mobility and flexibility exercises after strength training. Discussed POC with patient and the option of going to once a week or going to every other week. Patient will benefit  from skilled PT to address the below impairments and improve overall function.      OBJECTIVE IMPAIRMENTS: decreased knowledge of condition, decreased mobility, decreased ROM, decreased strength, increased fascial restrictions, increased muscle spasms, impaired flexibility, impaired sensation, improper body mechanics, postural dysfunction, and pain.   ACTIVITY LIMITATIONS: sitting, squatting, and stairs  PARTICIPATION LIMITATIONS: driving, community activity, and occupation  PERSONAL FACTORS: Time since onset of injury/illness/exacerbation are also affecting patient's functional outcome.   REHAB POTENTIAL: Excellent  CLINICAL DECISION MAKING: Evolving/moderate complexity  EVALUATION COMPLEXITY: Moderate   GOALS: Goals reviewed with patient? Yes  SHORT TERM GOALS: Target date: 07/25/24  Pt will be ind with initial HEP Baseline: Goal status: MET 07/17/2024  2.  Pt will report reduced pain with sitting for work by at least 25% Baseline:  Goal status: MET 07/25/2024  3.  Pt will be able to achieve stretching positions for Rt hip flexor and quad without pain Baseline:  Goal status: INITIAL  4.  Pt will be able to demo  symmetrical pelvis with 1/2 squat Baseline:  Goal status: MET 07/25/2024    LONG TERM GOALS: Target date: 08/22/24  Pt will be ind with advanced HEP Baseline:  Goal status: INITIAL  2.  Pt will be able to demo symmetrical pelvic control with full squat Baseline:  Goal status: INITIAL  3.  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  Pt will improve ODI to </= 20% to reduce disability level from moderate to min on ODI scale. Baseline: 13/50 = 26% Goal status: INITIAL  5.  Pt will report overall pain not to exceed 3/10 most days of the week. Baseline:  Goal status: INITIAL    PLAN:  PT FREQUENCY: 1-2x/week  PT DURATION: 8 weeks  PLANNED INTERVENTIONS: 97110-Therapeutic exercises, 97530- Therapeutic activity, 97112- Neuromuscular re-education, 97535- Self Care, 02859- Manual therapy, G0283- Electrical stimulation (unattended), [phone]- Electrical stimulation (manual), [id number]- Traction (mechanical), [id number]- Ionotophoresis 4mg /ml Dexamethasone , 79439 (1-2 muscles), 20561 (3+ muscles)- Dry Needling, Patient/Family education, Stair training, Taping, Joint mobilization, Spinal mobilization, Scar mobilization, Cryotherapy, and Moist heat.  PLAN FOR NEXT SESSION: assess updated HEP; ecc step downs; proximal strengthening; strength for abd/ER/ext, functional body mechanics training working on symmetrical pelvis in squat; scar massage as needed    Kristeen Sar, PT 08/01/24 12:01 PM [facility] [address] [place], [place] [address] Phone # [phone] Fax [phone]

## 2024-08-05 ENCOUNTER — Ambulatory Visit: Admitting: Rehabilitative and Restorative Service Providers"

## 2024-08-05 ENCOUNTER — Encounter: Payer: Self-pay | Admitting: Rehabilitative and Restorative Service Providers"

## 2024-08-05 DIAGNOSIS — R252 Cramp and spasm: Secondary | ICD-10-CM

## 2024-08-05 DIAGNOSIS — M6281 Muscle weakness (generalized): Secondary | ICD-10-CM | POA: Diagnosis not present

## 2024-08-05 DIAGNOSIS — M25551 Pain in right hip: Secondary | ICD-10-CM | POA: Diagnosis not present

## 2024-08-05 NOTE — Therapy (Signed)
 OUTPATIENT PHYSICAL THERAPY TREATMENT NOTE   Patient Name: [name] MRN: [id number] DOB:06/16/1976, 48 y.o., female Today's Date: 08/05/2024  END OF SESSION:  PT End of Session - 08/05/24 1406     Visit Number 9    Date for Recertification  08/22/24    Authorization Type BC[employer] (DN signed 10/3)    PT Start Time 1405    PT Stop Time 1445    PT Time Calculation (min) 40 min    Activity Tolerance Patient tolerated treatment well    Behavior During Therapy WFL for tasks assessed/performed                Past Medical History:  Diagnosis Date   Abnormal uterine bleeding (AUB)    Allergy    Anemia    Anxiety    Arthritis    FEET   Asthma    Chronic kidney disease    Depression    Environmental allergies    GERD (gastroesophageal reflux disease)    Heavy menstrual bleeding    History of pregnancy induced hypertension    Hypertension    Wears contact lenses    Past Surgical History:  Procedure Laterality Date   ABDOMINOPLASTY  05/2020   per PT THEN HAD REVISION 04/ 2022   AUGMENTATION MAMMAPLASTY Bilateral 06/05/2023   CESAREAN SECTION     11-30-2005;    @[facility]  by dr darcel  10-28-2007   COMBINED AUGMENTATION MAMMAPLASTY AND ABDOMINOPLASTY  06/05/2023   Also had liposuction   DILITATION & CURRETTAGE/HYSTROSCOPY WITH NOVASURE ABLATION N/A 10/04/2022   Procedure: DILATATION & CURETTAGE/HYSTEROSCOPY WITH NOVASURE ABLATION;  Surgeon: Cleatus Moccasin, MD;  Location: [facility];  Service: Gynecology;  Laterality: N/A;   NASAL SINUS SURGERY  1995   Patient Active Problem List   Diagnosis Date Noted   Lichen sclerosus 06/02/2024   S/P endometrial ablation 06/02/2024   History of abdominoplasty 06/02/2024   History of heavy vaginal bleeding 10/04/2022   Vitamin D  deficiency 05/14/2022    PCP:   REFERRING PROVIDER: Cleotilde Ronal RAMAN, MD  REFERRING DIAG: [phone] (ICD-10-CM) - History of abdominoplasty  Rationale for Evaluation and Treatment:  Rehabilitation  THERAPY DIAG:  Cramp and spasm  Pain in right hip  Muscle weakness (generalized)  ONSET DATE: Aug 2024 revision abdominoplasty with augmentation mammaplasty, initial abdominoplasty in 2021  SUBJECTIVE:                                                                                                                                                                                           SUBJECTIVE STATEMENT: Patient reports that she is just having a little  bit of muscle soreness, but she feels significantly better since dry needling last session.  PERTINENT HISTORY:  Initial abdomoniplasty in 2021 to repair rectus diastasis/hernia which then failed and needed revision in 2024.  My Rt hip is a mess - better now than it has been.  I did a ton of pure barre 18mos ago and it got really bad Had big babies - 10 and 11lb Gets kneecap pain - especially descending stairs Started HRT about 30 days ago  PAIN:  PAIN:  Are you having pain? Yes NPRS scale: 3-4/10 Pain location: one tiny spot in my right glute/hip Pain orientation: Right, anterior and across lower abdomen PAIN TYPE: aching, burning, and tight Pain description: intermittent and constant  Aggravating factors: sitting Relieving factors: stretching, moving   PRECAUTIONS: None  RED FLAGS: None   WEIGHT BEARING RESTRICTIONS: No  FALLS:  Has patient fallen in last 6 months? 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  DIAGNOSTIC  FINDINGS:    PATIENT SURVEYS:  Modified Oswestry:  MODIFIED OSWESTRY DISABILITY SCALE   Score Date: 06/27/24 08/05/2024  Pain intensity 1 = The pain is bad, but I can manage without having to take (1) I can stand as long as I want but, it increases my pain. pain medication. 0  2. Personal care (washing, dressing, etc.) 0 =  I can take care of myself normally without causing increased pain. 0  3. Lifting 0 = I can lift heavy weights without increased pain. 0  4. Walking 3 =  Pain prevents me from walking more than  mile. 0  5. Sitting 2 =  Pain prevents me from sitting more than 1 hour. 0  6. Standing 2 =  Pain prevents me from standing more than 1 hour 1  7. Sleeping 0 = Pain does not prevent me from sleeping well. 0  8. Social Life 1 =  My social life is normal, but it increases my level of pain. 0  9. Traveling 2 =  My pain restricts my travel over 2 hours. 0  10. Employment/ Homemaking 2 = I can perform most of my homemaking/job duties, but pain prevents me from performing more physically stressful activities (eg, lifting, vacuuming). 0  Total 13/50, 26% 1/50 = 2%   Interpretation of scores: Score Category Description  0-20% Minimal Disability The patient can cope with most living activities. Usually no treatment is indicated apart from advice on lifting, sitting and exercise  21-40% Moderate Disability The patient experiences more pain and difficulty with sitting, lifting and standing. Travel and social life are more difficult and they may be disabled from work. Personal care, sexual activity and sleeping are not grossly affected, and the patient can usually be managed by conservative means  41-60% Severe Disability Pain remains the main problem in this group, but activities of daily living are affected. These patients require a detailed investigation  61-80% Crippled Back pain impinges on all aspects of the patient's life. Positive intervention is required  81-100% Bed-bound  These  patients are either bed-bound or exaggerating their symptoms  Bluford FORBES [name] [name] [name], et al. Surgery versus conservative management of stable thoracolumbar fracture: the PRESTO feasibility RCT. [place] ([place]): [employer]; 2021 Nov. [facility] Technology Assessment, No. 25.62.) Appendix 3, Oswestry Disability Index category descriptors. Available from: FindJewelers.cz  Minimally Clinically Important Difference (MCID) = 12.8%  COGNITION: Overall cognitive status: Within functional limits for tasks assessed     SENSATION: Will sometimes get numbness on Rt hip  MUSCLE LENGTH: Limited ITB bil  Limited hip flexor and quad on Rt side - very tight and painful when assessing Limited adductors on Rt side  POSTURE: right pelvic obliquity, Rt ilial outflare  PALPATION: Muscle spasms with trigger points present: Rt ITB/lateral quad, iliacus, psoas, gluteals, piriformis, adductors Rt patella limited mobility with crepitus, tender bil distal ITB  LUMBAR ROM:   AROM eval  Flexion Fingers to toes no pain  Extension End range limited with Pulling through abdominal wall  Right lateral flexion Contralateral trunk tightness, pain in Rt hip  Left lateral flexion Contralateral trunk tightness  Right rotation 50% no pain  Left rotation 50% no pain   (Blank rows = not tested)  LOWER EXTREMITY ROM:    Rt hip hypermobile in IR/ER Rt hip Pain into FADIR and FABER Rt anterior hip and thigh pulling and pain with hip extension, limited by pain 50%  LOWER EXTREMITY MMT:   Rt hip abd and ER 4/5, all other muscle groups 5/5 bil LE  LUMBAR SPECIAL TESTS:  + Rt FADIR AND FABER Rt hip hypermobile for both IR/ER + Ober's test on Rt  FUNCTIONAL TESTS:  Significant pelvic rotation in squat with drop of Rt hemipelvis Increased pronation of Rt foot  GAIT: Distance walked:  Assistive device utilized: None Level of assistance: Complete Independence Comments:  no gait deviations  TREATMENT DATE:   08/05/2024: Recumbent Bike Level 4 x 4 mins- PT present to discuss status Modified Oswestry Disability Index:  1/50 = 2% Squat taps to PT mat 2x10 Quadruped bird dog 2x10 Quadruped fire hydrant x10 bilat Quadruped LE lift off with unilateral knee on yoga block 2x10 bilat 4 way resisted gait with 10# cable pulley x3 each direction Standing cross body chop with 10# cable pulley x10 bilat Standing lat pulldown 25# 2x10 (with cuing for improved posture/body mechanics) Standing march with row 5# 2x10 bilat   08/01/2024: Recumbent Bike Level 4 4 mins- PT present to discuss status Trigger Point Dry Needling  Subsequent Treatment: Instructions provided previously at initial dry needling treatment.   Patient Verbal Consent Given: Yes Education Handout Provided: Previously Provided Muscles Treated: Left lateral quad, Lt hip flexor, Lt glute & piriformis  Electrical Stimulation Performed: No Treatment Response/Outcome: Utilized skilled palpation to identify trigger points.  During dry needling able to palpate muscle twitch and muscle elongation   Piriformis stretch on plinth + lateral trunk bend 2 x 20 sec bilateral      07/25/2024: Recumbent Bike Level 4 5 mins- PT present to discuss status Squat assessment: slight right knee valgus at bottom when beginning ascent. Decreased right pelvic rotation compared to at equal Squat to plinth x 5 90/90 toe tap holding 10# DB  x 20 90/90 with one leg raising up and down x 15 each side holding 10# DB overhead Side plank + reach through x 12 each  Bird dog 2 x 10 Yoga block lift off x 10 bilateral  Fired hydrant x 10 bilateral (PT provided manual cue for  proper form) Pallof press at cable column 10# x 10 each direction Patient requested to leave appointment early     07/22/2024: Recumbent Bike Level 2 5 mins- PT present to discuss status 90/90 toe tap holding 7# DB 2 x 20 Bridge x 10 SL bridge x 12  bilateral  Pallof press at cable column 10# x 15 each direction Single arm row + march with cable column x 15 bilateral  Trigger Point Dry Needling  Subsequent Treatment: Instructions provided previously at initial dry needling treatment.   Patient Verbal Consent Given: Yes Education Handout Provided: Previously Provided Muscles Treated: Rt quads, hip flexor, gastroc, glutes Electrical Stimulation Performed: No Treatment Response/Outcome: Utilized skilled palpation to identify trigger points.  During dry needling able to palpate muscle twitch and muscle elongation  Soft tissue mobilization performed to further promote tissue elongation and decreased pain.                                   PATIENT EDUCATION:  Education details: [email or web address] Person educated: Patient Education method: Programmer, multimedia, Facilities manager, Verbal cues, and Handouts Personnel officer) Education comprehension: verbalized understanding and returned demonstration  HOME EXERCISE PROGRAM: Access Code: [email or web address] URL: [email or web address] Date: 07/25/2024 Prepared by: Kristeen Sar  Exercises - Seated Lateral Trunk Stretch on Swiss Ball  - 1 x daily - 7 x weekly - 1 sets - 3 reps - 10-30 sec hold - Standing Hip Flexor Stretch With Overhead Reach and Contralateral Sidebend  - 1 x daily - 7 x weekly - 1 sets - 3 reps - 10-30 sec hold - Clamshell  - 1 x daily - 7 x weekly - 2 sets - 10 reps - Supine Figure 4 Piriformis Stretch  - 1 x daily - 7 x weekly - 2 reps - 20 sec hold - Supine ITB Stretch with Strap  - 1 x daily - 7 x weekly - 2 reps - 20 sec hold - Supine Dead Bug with Leg Extension  - 1 x daily - 7 x weekly - 2 sets - 10 reps - Static Prone on Elbows  - 1 x daily - 7 x weekly - 1-2 reps - 3-5 min hold - Prone Alternating Arm and Leg Lifts  - 1 x daily - 7 x weekly - 2 sets - 10 reps - Single Leg Bridge  - 1 x daily - 7 x weekly - 1-2 sets - 10 reps - Side Plank with Clam and Resistance  - 1 x daily - 7 x weekly  - 1 sets - 10 reps - Single Leg Sit to Stand with Arms Crossed  - 1 x daily - 7 x weekly - 1 sets - 10 reps - Quadruped Yoga Block Lift Off  - 1 x daily - 7 x weekly - 1-2 sets - 10-15 reps - Quadruped Fire Hydrant  - 1 x daily - 7 x weekly - 1-2 sets - 10 reps  ASSESSMENT:  CLINICAL IMPRESSION:  Leighton presents to skilled PT reporting that dry needling seemed to help a lot and that she has been doing the [employer], as recommended.  Patient with much improved score today on Modified Oswestry and is progressing towards all goals.  Patient with improved body mechanics with quadruped exercises today compared to what was described last visit.  Patient was able to continue to progress with various exercises that progressed core and hip strengthening. Patient states that she may want  dry needling again next session, as it has been very helpful.  Patient continues to require skilled PT to progress towards goal related activities.   OBJECTIVE IMPAIRMENTS: decreased knowledge of condition, decreased mobility, decreased ROM, decreased strength, increased fascial restrictions, increased muscle spasms, impaired flexibility, impaired sensation, improper body mechanics, postural dysfunction, and pain.   ACTIVITY LIMITATIONS: sitting, squatting, and stairs  PARTICIPATION LIMITATIONS: driving, community activity, and occupation  PERSONAL FACTORS: Time since onset of injury/illness/exacerbation are also affecting patient's functional outcome.   REHAB POTENTIAL: Excellent  CLINICAL DECISION MAKING: Evolving/moderate complexity  EVALUATION COMPLEXITY: Moderate   GOALS: Goals reviewed with patient? Yes  SHORT TERM GOALS: Target date: 07/25/24  Pt will be ind with initial HEP Baseline: Goal status: MET 07/17/2024  2.  Pt will report reduced pain with sitting for work by at least 25% Baseline:  Goal status: MET 07/25/2024  3.  Pt will be able to achieve stretching positions for Rt hip flexor  and quad without pain Baseline:  Goal status: Met on 08/05/24  4.  Pt will be able to demo symmetrical pelvis with 1/2 squat Baseline:  Goal status: MET 07/25/2024    LONG TERM GOALS: Target date: 08/22/24  Pt will be ind with advanced HEP Baseline:  Goal status: Ongoing  2.  Pt will be able to demo symmetrical pelvic control with full squat Baseline:  Goal status: Ongoing  3.  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  Pt will improve ODI to </= 20% to reduce disability level from moderate to min on ODI scale. Baseline: 13/50 = 26% Goal status: Met on 08/05/2024 (1/50 = 2%)  5.  Pt will report overall pain not to exceed 3/10 most days of the week. Baseline:  Goal status: Ongoing    PLAN:  PT FREQUENCY: 1-2x/week  PT DURATION: 8 weeks  PLANNED INTERVENTIONS: 97110-Therapeutic exercises, 97530- Therapeutic activity, 97112- Neuromuscular re-education, 97535- Self Care, 02859- Manual therapy, G0283- Electrical stimulation (unattended), [phone]- Electrical stimulation (manual), [id number]- Traction (mechanical), [id number]- Ionotophoresis 4mg /ml Dexamethasone , 79439 (1-2 muscles), 20561 (3+ muscles)- Dry Needling, Patient/Family education, Stair training, Taping, Joint mobilization, Spinal mobilization, Scar mobilization, Cryotherapy, and Moist heat.  PLAN FOR NEXT SESSION: assess updated HEP; ecc step downs; proximal strengthening; strength for abd/ER/ext, functional body mechanics training working on symmetrical pelvis in squat; scar massage as needed    Jarrell Laming, PT, DPT 08/05/24, 2:49 PM  [facility] [address], [address] [place], [place] [address] Phone # [phone] Fax [phone]

## 2024-08-07 ENCOUNTER — Ambulatory Visit: Admitting: Physical Therapy

## 2024-08-07 ENCOUNTER — Encounter: Payer: Self-pay | Admitting: Physical Therapy

## 2024-08-07 DIAGNOSIS — M25551 Pain in right hip: Secondary | ICD-10-CM | POA: Diagnosis not present

## 2024-08-07 DIAGNOSIS — R252 Cramp and spasm: Secondary | ICD-10-CM

## 2024-08-07 DIAGNOSIS — M6281 Muscle weakness (generalized): Secondary | ICD-10-CM

## 2024-08-07 NOTE — Therapy (Signed)
 OUTPATIENT PHYSICAL THERAPY TREATMENT NOTE/ RE-CERTIFICATION   Patient Name: [name] MRN: [id number] DOB:01/14/1976, 48 y.o., female Today's Date: 08/07/2024  END OF SESSION:  PT End of Session - 08/07/24 1703     Visit Number 10    Date for Recertification  09/19/24    Authorization Type BC[employer] (DN signed 10/3)    PT Start Time 1536    PT Stop Time 1620    PT Time Calculation (min) 44 min    Activity Tolerance Patient tolerated treatment well    Behavior During Therapy WFL for tasks assessed/performed                 Past Medical History:  Diagnosis Date   Abnormal uterine bleeding (AUB)    Allergy    Anemia    Anxiety    Arthritis    FEET   Asthma    Chronic kidney disease    Depression    Environmental allergies    GERD (gastroesophageal reflux disease)    Heavy menstrual bleeding    History of pregnancy induced hypertension    Hypertension    Wears contact lenses    Past Surgical History:  Procedure Laterality Date   ABDOMINOPLASTY  05/2020   per PT THEN HAD REVISION 04/ 2022   AUGMENTATION MAMMAPLASTY Bilateral 06/05/2023   CESAREAN SECTION     11-30-2005;    @[facility]  by dr darcel  10-28-2007   COMBINED AUGMENTATION MAMMAPLASTY AND ABDOMINOPLASTY  06/05/2023   Also had liposuction   DILITATION & CURRETTAGE/HYSTROSCOPY WITH NOVASURE ABLATION N/A 10/04/2022   Procedure: DILATATION & CURETTAGE/HYSTEROSCOPY WITH NOVASURE ABLATION;  Surgeon: Cleatus Moccasin, MD;  Location: [facility] [place];  Service: Gynecology;  Laterality: N/A;   NASAL SINUS SURGERY  1995   Patient Active Problem List   Diagnosis Date Noted   Lichen sclerosus 06/02/2024   S/P endometrial ablation 06/02/2024   History of abdominoplasty 06/02/2024   History of heavy vaginal bleeding 10/04/2022   Vitamin D  deficiency 05/14/2022    PCP:   REFERRING PROVIDER: Cleotilde Ronal RAMAN, MD  REFERRING DIAG: [phone] (ICD-10-CM) - History of abdominoplasty  Rationale for  Evaluation and Treatment: Rehabilitation  THERAPY DIAG:  Cramp and spasm - Plan: PT plan of care cert/re-cert  Pain in right hip - Plan: PT plan of care cert/re-cert  Muscle weakness (generalized) - Plan: PT plan of care cert/re-cert  ONSET DATE: Aug 2024 revision abdominoplasty with augmentation mammaplasty, initial abdominoplasty in 2021  SUBJECTIVE:  SUBJECTIVE STATEMENT: Patient reports she is doing okay today. She is having some muscle soreness but not the deep achy pain.  PERTINENT HISTORY:  Initial abdomoniplasty in 2021 to repair rectus diastasis/hernia which then failed and needed revision in 2024.  My Rt hip is a mess - better now than it has been.  I did a ton of pure barre 18mos ago and it got really bad Had big babies - 10 and 11lb Gets kneecap pain - especially descending stairs Started HRT about 30 days ago  PAIN:  PAIN:  Are you having pain? Yes NPRS scale: 3-4/10 Pain location: one tiny spot in my right glute/hip Pain orientation: Right, anterior and across lower abdomen PAIN TYPE: aching, burning, and tight Pain description: intermittent and constant  Aggravating factors: sitting Relieving factors: stretching, moving   PRECAUTIONS: None  RED FLAGS: None   WEIGHT BEARING RESTRICTIONS: No  FALLS:  Has patient fallen in last 6 months? 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  DIAGNOSTIC FINDINGS:    PATIENT SURVEYS:  Modified Oswestry:  MODIFIED OSWESTRY DISABILITY SCALE   Score Date: 06/27/24 08/05/2024  Pain intensity 1 = The pain is bad, but I can manage without having to take (1) I can stand as long as I want but, it increases my pain. pain medication. 0  2. Personal care (washing, dressing, etc.) 0 =  I can take care of myself normally without causing increased pain. 0  3. Lifting 0 = I can lift heavy weights without increased pain. 0  4. Walking 3 =  Pain prevents me from walking more than  mile. 0  5. Sitting 2 =  Pain prevents me from sitting more than 1 hour. 0  6. Standing 2 =  Pain prevents me from standing more than 1 hour 1  7. Sleeping 0 = Pain does not prevent me from sleeping well. 0  8. Social Life 1 =  My social life is normal, but it increases my level of pain. 0  9. Traveling 2 =  My pain restricts my travel over 2 hours. 0  10. Employment/ Homemaking 2 = I can perform most of my homemaking/job duties, but pain prevents me from performing more physically stressful activities (eg, lifting, vacuuming). 0  Total 13/50, 26% 1/50 = 2%   Interpretation of scores: Score Category Description  0-20% Minimal Disability The patient can cope with most living activities. Usually no treatment is indicated apart from advice on lifting, sitting and exercise  21-40% Moderate Disability The patient experiences more pain and difficulty with sitting, lifting and standing. Travel and social life are more difficult and they may be disabled from work. Personal care, sexual activity and sleeping are not grossly affected, and the patient can usually be managed by conservative means  41-60% Severe Disability Pain remains the main problem in this group, but activities of daily living are affected. These patients require a detailed investigation  61-80% Crippled Back pain  impinges on all aspects of the patient's life. Positive intervention  is required  81-100% Bed-bound  These patients are either bed-bound or exaggerating their symptoms  Bluford FORBES [name] [name] [name], et al. Surgery versus conservative management of stable thoracolumbar fracture: the PRESTO feasibility RCT. [place] ([place]): [employer]; 2021 Nov. [facility] Technology Assessment, No. 25.62.) Appendix 3, Oswestry Disability Index category descriptors. Available from: FindJewelers.cz  Minimally Clinically Important Difference (MCID) = 12.8%  COGNITION: Overall cognitive status: Within functional limits for tasks assessed     SENSATION: Will sometimes get numbness on Rt hip  MUSCLE LENGTH: Limited ITB bil  Limited hip flexor and quad on Rt side - very tight and painful when assessing Limited adductors on Rt side  POSTURE: right pelvic obliquity, Rt ilial outflare  PALPATION: Muscle spasms with trigger points present: Rt ITB/lateral quad, iliacus, psoas, gluteals, piriformis, adductors Rt patella limited mobility with crepitus, tender bil distal ITB  LUMBAR ROM:   AROM eval  Flexion Fingers to toes no pain  Extension End range limited with Pulling through abdominal wall  Right lateral flexion Contralateral trunk tightness, pain in Rt hip  Left lateral flexion Contralateral trunk tightness  Right rotation 50% no pain  Left rotation 50% no pain   (Blank rows = not tested)  LOWER EXTREMITY ROM:    Rt hip hypermobile in IR/ER Rt hip Pain into FADIR and FABER Rt anterior hip and thigh pulling and pain with hip extension, limited by pain 50%  LOWER EXTREMITY MMT:   Rt hip abd and ER 4/5, all other muscle groups 5/5 bil LE  LUMBAR SPECIAL TESTS:  + Rt FADIR AND FABER Rt hip hypermobile for both IR/ER + Ober's test on Rt  FUNCTIONAL TESTS:  Significant pelvic rotation in squat with drop of Rt hemipelvis Increased pronation of Rt  foot  GAIT: Distance walked:  Assistive device utilized: None Level of assistance: Complete Independence Comments: no gait deviations  TREATMENT DATE:  08/07/2024: Recumbent Bike Level 3 x 5 mins- PT present to discuss status Half kneeling chop with blue TB x 10 bilateral  Bird dog crunch with blue TB (PT anchoring) 2 x 10 bilateral  Plank off plinth + cross body march x 10 bilateral  Pallof press at cable column 10# 2 x 15 bilateral  Standing chop at cable column 10# x 15 bilateral  Around the world 15# KB x 12 each direction Manual: scar tissue massage with silicon cup for improved tissue mobility and decreased restriction   08/05/2024: Recumbent Bike Level 4 x 4 mins- PT present to discuss status Modified Oswestry Disability Index:  1/50 = 2% Squat taps to PT mat 2x10 Quadruped bird dog 2x10 Quadruped fire hydrant x10 bilat Quadruped LE lift off with unilateral knee on yoga block 2x10 bilat 4 way resisted gait with 10# cable pulley x3 each direction Standing cross body chop with 10# cable pulley x10 bilat Standing lat pulldown 25# 2x10 (with cuing for improved posture/body mechanics) Standing march with row 5# 2x10 bilat   08/01/2024: Recumbent Bike Level 4 4 mins- PT present to discuss status Trigger Point Dry Needling  Subsequent Treatment: Instructions provided previously at initial dry needling treatment.   Patient Verbal Consent Given: Yes Education Handout Provided: Previously Provided Muscles Treated: Left lateral quad, Lt hip flexor, Lt glute & piriformis  Electrical Stimulation Performed: No Treatment Response/Outcome: Utilized skilled palpation to identify trigger points.  During dry needling able to palpate muscle twitch and muscle elongation   Piriformis stretch on plinth + lateral trunk bend 2 x 20 sec  bilateral                             PATIENT EDUCATION:  Education details: [email or web address] Person educated: Patient Education method: Programmer, multimedia,  Facilities manager, Verbal cues, and Handouts Personnel officer) Education comprehension: verbalized understanding and returned demonstration  HOME EXERCISE PROGRAM: Access Code: [email or web address] URL: [email or web address] Date: 07/25/2024 Prepared by: Kristeen Sar  Exercises - Seated Lateral Trunk Stretch on Swiss Ball  - 1 x daily - 7 x weekly - 1 sets - 3 reps - 10-30 sec hold - Standing Hip Flexor Stretch With Overhead Reach and Contralateral Sidebend  - 1 x daily - 7 x weekly - 1 sets - 3 reps - 10-30 sec hold - Clamshell  - 1 x daily - 7 x weekly - 2 sets - 10 reps - Supine Figure 4 Piriformis Stretch  - 1 x daily - 7 x weekly - 2 reps - 20 sec hold - Supine ITB Stretch with Strap  - 1 x daily - 7 x weekly - 2 reps - 20 sec hold - Supine Dead Bug with Leg Extension  - 1 x daily - 7 x weekly - 2 sets - 10 reps - Static Prone on Elbows  - 1 x daily - 7 x weekly - 1-2 reps - 3-5 min hold - Prone Alternating Arm and Leg Lifts  - 1 x daily - 7 x weekly - 2 sets - 10 reps - Single Leg Bridge  - 1 x daily - 7 x weekly - 1-2 sets - 10 reps - Side Plank with Clam and Resistance  - 1 x daily - 7 x weekly - 1 sets - 10 reps - Single Leg Sit to Stand with Arms Crossed  - 1 x daily - 7 x weekly - 1 sets - 10 reps - Quadruped Yoga Block Lift Off  - 1 x daily - 7 x weekly - 1-2 sets - 10-15 reps - Quadruped Fire Hydrant  - 1 x daily - 7 x weekly - 1-2 sets - 10 reps  ASSESSMENT:  CLINICAL IMPRESSION: Lydia is responding very well to skilled therapy. She feels she is getting stronger in her core and hips, and she has incorporated more strengthening exercises into her routine. With the addition of dry needling she has had a great reduction in the deep burning pain she started with at the begin of POC. The sensation has subsided to an achy/soreness pain. All short term goals are met at this time and patient is progress well to meet remaining long term goals. Each session we have been able to further progress  her exercises. PT provides verbal and visual cues to ensure proper performance. Patient would benefit from continued therapy to work on pelvic alignment, core strengthening, and soft tissue mobility to reduce muscle spasms.    OBJECTIVE IMPAIRMENTS: decreased knowledge of condition, decreased mobility, decreased ROM, decreased strength, increased fascial restrictions, increased muscle spasms, impaired flexibility, impaired sensation, improper body mechanics, postural dysfunction, and pain.   ACTIVITY LIMITATIONS: sitting, squatting, and stairs  PARTICIPATION LIMITATIONS: driving, community activity, and occupation  PERSONAL FACTORS: Time since onset of injury/illness/exacerbation are also affecting patient's functional outcome.   REHAB POTENTIAL: Excellent  CLINICAL DECISION MAKING: Evolving/moderate complexity  EVALUATION COMPLEXITY: Moderate   GOALS: Goals reviewed with patient? Yes  SHORT TERM GOALS: Target date: 07/25/24  Pt will be ind with initial HEP Baseline: Goal status: MET 07/17/2024  2.  Pt will  report reduced pain with sitting for work by at least 25% Baseline:  Goal status: MET 07/25/2024  3.  Pt will be able to achieve stretching positions for Rt hip flexor and quad without pain Baseline:  Goal status: Met on 08/05/24  4.  Pt will be able to demo symmetrical pelvis with 1/2 squat Baseline:  Goal status: MET 07/25/2024    LONG TERM GOALS: Target date: 09/19/2024  Pt will be ind with advanced HEP Baseline:  Goal status: Ongoing 08/07/2024  2.  Pt will be able to  demo symmetrical pelvic control with full squat and fire hydrant. Baseline:  Goal status: Ongoing (still required cues) 08/07/2024  3.  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  Pt will improve ODI to </= 20% to reduce disability level from moderate to min on ODI scale. Baseline: 13/50 =  26% Goal status: Met on 08/05/2024 (1/50 = 2%)  5.  Pt will report overall pain not to exceed 3/10 most days of the week. Baseline:  Goal status: Ongoing 08/07/2024  6.  Pt will be able to perform a regular strength training program with no pain or discomfort in her right hip. Baseline:  Goal status: NEW    PLAN:  PT FREQUENCY: 1-2x/week  PT DURATION: 6 weeks  PLANNED INTERVENTIONS: 97110-Therapeutic exercises, 97530- Therapeutic activity, 97112- Neuromuscular re-education, 97535- Self Care, 02859- Manual therapy, G0283- Electrical stimulation (unattended), [id number]- Electrical stimulation (manual), [id number]- Traction (mechanical), [id number]- Ionotophoresis 4mg /ml Dexamethasone , 79439 (1-2 muscles), 20561 (3+ muscles)- Dry Needling, Patient/Family education, Stair training, Taping, Joint mobilization, Spinal mobilization, Scar mobilization, Cryotherapy, and Moist heat.  PLAN FOR NEXT SESSION: **patient needs to schedule more appointments (1x week until 11/21) proximal strengthening; strength for abd/ER/ext, functional body mechanics training working on symmetrical pelvis in squat; scar massage as needed     Kristeen Sar, PT 08/07/24 5:05 PM [facility] [address] [place], [place] [address] Phone # [phone] Fax [phone]

## 2024-08-12 ENCOUNTER — Encounter: Payer: Self-pay | Admitting: Rehabilitative and Restorative Service Providers"

## 2024-08-12 ENCOUNTER — Ambulatory Visit: Admitting: Rehabilitative and Restorative Service Providers"

## 2024-08-12 DIAGNOSIS — R252 Cramp and spasm: Secondary | ICD-10-CM

## 2024-08-12 DIAGNOSIS — M6281 Muscle weakness (generalized): Secondary | ICD-10-CM | POA: Diagnosis not present

## 2024-08-12 DIAGNOSIS — M25551 Pain in right hip: Secondary | ICD-10-CM | POA: Diagnosis not present

## 2024-08-12 NOTE — Therapy (Signed)
 OUTPATIENT PHYSICAL THERAPY TREATMENT NOTE   Patient Name: [name] MRN: [id number] DOB:25-Sep-1976, 48 y.o., female Today's Date: 08/12/2024  END OF SESSION:  PT End of Session - 08/12/24 1405     Visit Number 11    Date for Recertification  09/19/24    Authorization Type BCBS (DN signed 10/3)    PT Start Time 1405    PT Stop Time 1445    PT Time Calculation (min) 40 min    Activity Tolerance Patient tolerated treatment well    Behavior During Therapy WFL for tasks assessed/performed                 Past Medical History:  Diagnosis Date   Abnormal uterine bleeding (AUB)    Allergy    Anemia    Anxiety    Arthritis    FEET   Asthma    Chronic kidney disease    Depression    Environmental allergies    GERD (gastroesophageal reflux disease)    Heavy menstrual bleeding    History of pregnancy induced hypertension    Hypertension    Wears contact lenses    Past Surgical History:  Procedure Laterality Date   ABDOMINOPLASTY  05/2020   per PT THEN HAD REVISION 04/ 2022   AUGMENTATION MAMMAPLASTY Bilateral 06/05/2023   CESAREAN SECTION     11-30-2005;    @[facility]  by dr darcel  10-28-2007   COMBINED AUGMENTATION MAMMAPLASTY AND ABDOMINOPLASTY  06/05/2023   Also had liposuction   DILITATION & CURRETTAGE/HYSTROSCOPY WITH NOVASURE ABLATION N/A 10/04/2022   Procedure: DILATATION & CURETTAGE/HYSTEROSCOPY WITH NOVASURE ABLATION;  Surgeon: Cleatus Moccasin, MD;  Location: [facility];  Service: Gynecology;  Laterality: N/A;   NASAL SINUS SURGERY  1995   Patient Active Problem List   Diagnosis Date Noted   Lichen sclerosus 06/02/2024   S/P endometrial ablation 06/02/2024   History of abdominoplasty 06/02/2024   History of heavy vaginal bleeding 10/04/2022   Vitamin D  deficiency 05/14/2022    PCP:   REFERRING PROVIDER: Cleotilde Ronal RAMAN, MD  REFERRING DIAG: [phone] (ICD-10-CM) - History of abdominoplasty  Rationale for Evaluation and Treatment:  Rehabilitation  THERAPY DIAG:  Cramp and spasm  Pain in right hip  Muscle weakness (generalized)  ONSET DATE: Aug 2024 revision abdominoplasty with augmentation mammaplasty, initial abdominoplasty in 2021  SUBJECTIVE:                                                                                                                                                                                           SUBJECTIVE STATEMENT: Patient reports that she really felt the suction  cup massage helped last visit.  PERTINENT HISTORY:  Initial abdomoniplasty in 2021 to repair rectus diastasis/hernia which then failed and needed revision in 2024.  My Rt hip is a mess - better now than it has been.  I did a ton of pure barre 18mos ago and it got really bad Had big babies - 10 and 11lb Gets kneecap pain - especially descending stairs Started HRT about 30 days ago  PAIN:  PAIN:  Are you having pain? Yes NPRS scale: 0-2/10 Pain location: one tiny spot in my right glute/hip Pain orientation: Right, anterior and across lower abdomen PAIN TYPE: aching, burning, and tight Pain description: intermittent and constant  Aggravating factors: sitting Relieving factors: stretching, moving   PRECAUTIONS: None  RED FLAGS: None   WEIGHT BEARING RESTRICTIONS: No  FALLS:  Has patient fallen in last 6 months? 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  DIAGNOSTIC FINDINGS:    PATIENT SURVEYS:  Modified Oswestry:  MODIFIED  OSWESTRY DISABILITY SCALE   Score Date: 06/27/24 08/05/2024  Pain intensity 1 = The pain is bad, but I can manage without having to take (1) I can stand as long as I want but, it increases my pain. pain medication. 0  2. Personal care (washing, dressing, etc.) 0 =  I can take care of myself normally without causing increased pain. 0  3. Lifting 0 = I can lift heavy weights without increased pain. 0  4. Walking 3 =  Pain prevents me from walking more than  mile. 0  5. Sitting 2 =  Pain prevents me from sitting more than 1 hour. 0  6. Standing 2 =  Pain prevents me from standing more than 1 hour 1  7. Sleeping 0 = Pain does not prevent me from sleeping well. 0  8. Social Life 1 =  My social life is normal, but it increases my level of pain. 0  9. Traveling 2 =  My pain restricts my travel over 2 hours. 0  10. Employment/ Homemaking 2 = I can perform most of my homemaking/job duties, but pain prevents me from performing more physically stressful activities (eg, lifting, vacuuming). 0  Total 13/50, 26% 1/50 = 2%   Interpretation of scores: Score Category Description  0-20% Minimal Disability The patient can cope with most living activities. Usually no treatment is indicated apart from advice on lifting, sitting and exercise  21-40% Moderate Disability The patient experiences more pain and difficulty with sitting, lifting and standing. Travel and social life are more difficult and they may be disabled from work. Personal care, sexual activity and sleeping are not grossly affected, and the patient can usually be managed by conservative means  41-60% Severe Disability Pain remains the main problem in this group, but activities of daily living are affected. These patients require a detailed investigation  61-80% Crippled Back pain impinges on all aspects of the patient's life. Positive intervention is required  81-100% Bed-bound  These patients are either bed-bound or exaggerating their symptoms  Bluford  E,  Scantlebury A, Booth A, et al. Surgery versus conservative management of stable thoracolumbar fracture: the PRESTO feasibility RCT. [place]): [employer]; 2021 Nov. [facility] Technology Assessment, No. 25.62.) Appendix 3, Oswestry Disability Index category descriptors. Available from: FindJewelers.cz  Minimally Clinically Important Difference (MCID) = 12.8%  COGNITION: Overall cognitive status: Within functional limits for tasks assessed     SENSATION: Will sometimes get numbness on Rt hip  MUSCLE LENGTH: Limited ITB bil  Limited hip flexor and quad on Rt side - very tight and painful when assessing Limited adductors on Rt side  POSTURE: right pelvic obliquity, Rt ilial outflare  PALPATION: Muscle spasms with trigger points present: Rt ITB/lateral quad, iliacus, psoas, gluteals, piriformis, adductors Rt patella limited mobility with crepitus, tender bil distal ITB  LUMBAR ROM:   AROM eval  Flexion Fingers to toes no pain  Extension End range limited with Pulling through abdominal wall  Right lateral flexion Contralateral trunk tightness, pain in Rt hip  Left lateral flexion Contralateral trunk tightness  Right rotation 50% no pain  Left rotation 50% no pain   (Blank rows = not tested)  LOWER EXTREMITY ROM:    Rt hip hypermobile in IR/ER Rt hip Pain into FADIR and FABER Rt anterior hip and thigh pulling and pain with hip extension, limited by pain 50%  LOWER EXTREMITY MMT:   Rt hip abd and ER 4/5, all other muscle groups 5/5 bil LE  LUMBAR SPECIAL TESTS:  + Rt FADIR AND FABER Rt hip hypermobile for both IR/ER + Ober's test on Rt  FUNCTIONAL TESTS:  Significant pelvic rotation in squat with drop of Rt hemipelvis Increased pronation of Rt foot  GAIT: Distance walked:  Assistive device utilized: None Level of assistance: Complete Independence Comments: no gait deviations  TREATMENT DATE:   08/12/2024: Recumbent Bike  Level 3 x 5 mins- PT present to discuss status Half kneeling chop with blue TB x 16 bilateral  Bird dog crunch with blue TB (PT anchoring) 2 x 10 bilateral  Plank off plinth + cross body march x 10 bilateral  Side plank with open book reach x10 bilat Pallof press at cable column 10# 2 x 15 bilateral  Squat taps to PT mat 2x10 Quadruped LE lift off with unilateral knee on yoga block x10 bilat Manual: scar tissue massage with silicon cup for improved tissue mobility and decreased restriction    08/07/2024: Recumbent Bike Level 3 x 5 mins- PT present to discuss status Half kneeling chop with blue TB x 10 bilateral  Bird dog crunch with blue TB (PT anchoring) 2 x 10 bilateral  Plank off plinth + cross body march x 10 bilateral  Pallof press at cable column 10# 2 x 15 bilateral  Standing chop at cable column 10# x 15 bilateral  Around the world 15# KB x 12 each direction Manual: scar tissue massage with silicon cup for improved tissue mobility and decreased restriction   08/05/2024: Recumbent Bike Level 4 x 4 mins- PT present to discuss status Modified Oswestry Disability Index:  1/50 = 2% Squat taps to PT mat 2x10 Quadruped bird dog 2x10 Quadruped fire hydrant x10 bilat Quadruped LE lift off with unilateral knee on yoga block 2x10 bilat 4 way resisted gait with 10# cable pulley x3 each direction Standing cross body chop with 10# cable pulley x10 bilat Standing lat pulldown 25# 2x10 (with cuing for improved posture/body mechanics) Standing march with row 5# 2x10 bilat  PATIENT EDUCATION:  Education details: [email or web address] Person educated: Patient Education method: Programmer, multimedia, Facilities manager, Verbal cues, and Handouts Personnel officer) Education comprehension: verbalized understanding and returned demonstration  HOME EXERCISE PROGRAM: Access Code: [email or web address] URL: [email or web address] Date: 07/25/2024 Prepared by: Kristeen Sar  Exercises - Seated Lateral  Trunk Stretch on Swiss Ball  - 1 x daily - 7 x weekly - 1 sets - 3 reps - 10-30 sec hold - Standing Hip Flexor Stretch With Overhead Reach and Contralateral Sidebend  - 1 x daily - 7 x weekly - 1 sets - 3 reps - 10-30 sec hold - Clamshell  - 1 x daily - 7 x weekly - 2 sets - 10 reps - Supine Figure 4 Piriformis Stretch  - 1 x daily - 7 x weekly - 2 reps - 20 sec hold - Supine ITB Stretch with Strap  - 1 x daily - 7 x weekly - 2 reps - 20 sec hold - Supine Dead Bug with Leg Extension  - 1 x daily - 7 x weekly - 2 sets - 10 reps - Static Prone on Elbows  - 1 x daily - 7 x weekly - 1-2 reps - 3-5 min hold - Prone Alternating Arm and Leg Lifts  - 1 x daily - 7 x weekly - 2 sets - 10 reps - Single Leg Bridge  - 1 x daily - 7 x weekly - 1-2 sets - 10 reps - Side Plank with Clam and Resistance  - 1 x daily - 7 x weekly - 1 sets - 10 reps - Single Leg Sit to Stand with Arms Crossed  - 1 x daily - 7 x weekly - 1 sets - 10 reps - Quadruped Yoga Block Lift Off  - 1 x daily - 7 x weekly - 1-2 sets - 10-15 reps - Quadruped Fire Hydrant  - 1 x daily - 7 x weekly - 1-2 sets - 10 reps  ASSESSMENT:  CLINICAL IMPRESSION:  [name] presents to skilled PT reporting that the suction cup massage seemed to help last time and patient was curious if she could perform this at home.  Patient educated on how to perform the suction cup massage at home, she verbalizes understanding.  Patient continues to progress with core strengthening and stability exercises during session.  Educated patient on the role that fascia plays in the body and how when one area is tight/adhered, it can lead to pulling from other areas of the body.  Patient continues to report decreased pain and the feeling of increased tissue mobility after silicone cup massage.  Patient continues to require skilled PT to progress towards goal related activities.    OBJECTIVE IMPAIRMENTS: decreased knowledge of condition, decreased mobility, decreased ROM,  decreased strength, increased fascial restrictions, increased muscle spasms, impaired flexibility, impaired sensation, improper body mechanics, postural dysfunction, and pain.   ACTIVITY LIMITATIONS: sitting, squatting, and stairs  PARTICIPATION LIMITATIONS: driving, community activity, and occupation  PERSONAL FACTORS: Time since onset of injury/illness/exacerbation are also affecting patient's functional outcome.   REHAB POTENTIAL: Excellent  CLINICAL DECISION MAKING: Evolving/moderate complexity  EVALUATION COMPLEXITY: Moderate   GOALS: Goals reviewed with patient? Yes  SHORT TERM GOALS: Target date: 07/25/24  Pt will be ind with initial HEP Baseline: Goal status: MET 07/17/2024  2.  Pt will report reduced pain with sitting for work by at least 25% Baseline:  Goal status: MET 07/25/2024  3.  Pt will be able to achieve stretching positions for Rt hip flexor and quad without  pain Baseline:  Goal status: Met on 08/05/24  4.  Pt will be able to demo symmetrical pelvis with 1/2 squat Baseline:  Goal status: MET 07/25/2024    LONG TERM GOALS: Target date: 09/19/2024  Pt will be ind with advanced HEP Baseline:  Goal status: Ongoing 08/07/2024  2.  Pt will be able to  demo symmetrical pelvic control with full squat and fire hydrant. Baseline:  Goal status: Ongoing (still required cues) 08/07/2024  3.  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  Pt will improve ODI to </= 20% to reduce disability level from moderate to min on ODI scale. Baseline: 13/50 = 26% Goal status: Met on 08/05/2024 (1/50 = 2%)  5.  Pt will report overall pain not to exceed 3/10 most days of the week. Baseline:  Goal status: Ongoing 08/07/2024  6.  Pt will be able to perform a regular strength training program with no pain or discomfort in her right hip. Baseline:  Goal status:  NEW    PLAN:  PT FREQUENCY: 1-2x/week  PT DURATION: 6 weeks  PLANNED INTERVENTIONS: 97110-Therapeutic exercises, 97530- Therapeutic activity, 97112- Neuromuscular re-education, 97535- Self Care, 02859- Manual therapy, G0283- Electrical stimulation (unattended), [phone]- Electrical stimulation (manual), [id number]- Traction (mechanical), [id number]- Ionotophoresis 4mg /ml Dexamethasone , 79439 (1-2 muscles), 20561 (3+ muscles)- Dry Needling, Patient/Family education, Stair training, Taping, Joint mobilization, Spinal mobilization, Scar mobilization, Cryotherapy, and Moist heat.  PLAN FOR NEXT SESSION: **patient needs to schedule more appointments (1x week until 11/21) proximal strengthening; strength for abd/ER/ext, functional body mechanics training working on symmetrical pelvis in squat; scar massage as needed     Jarrell Laming, PT, DPT 08/12/24, 3:11 PM  [facility] [address] [place], [place] [address] Phone # [phone] Fax [phone]

## 2024-08-13 ENCOUNTER — Ambulatory Visit: Admitting: Rehabilitative and Restorative Service Providers"

## 2024-08-13 ENCOUNTER — Encounter: Payer: Self-pay | Admitting: Rehabilitative and Restorative Service Providers"

## 2024-08-13 ENCOUNTER — Other Ambulatory Visit: Payer: Self-pay

## 2024-08-13 ENCOUNTER — Encounter: Payer: Self-pay | Admitting: Sports Medicine

## 2024-08-13 ENCOUNTER — Ambulatory Visit (INDEPENDENT_AMBULATORY_CARE_PROVIDER_SITE_OTHER): Admitting: Sports Medicine

## 2024-08-13 DIAGNOSIS — M25551 Pain in right hip: Secondary | ICD-10-CM | POA: Diagnosis not present

## 2024-08-13 DIAGNOSIS — M6281 Muscle weakness (generalized): Secondary | ICD-10-CM

## 2024-08-13 DIAGNOSIS — R252 Cramp and spasm: Secondary | ICD-10-CM | POA: Diagnosis not present

## 2024-08-13 DIAGNOSIS — M7989 Other specified soft tissue disorders: Secondary | ICD-10-CM

## 2024-08-13 NOTE — Progress Notes (Signed)
 [name] - 48 y.o. female MRN [id number]  Date of birth: 04/05/1976  Office Visit Note: Visit Date: 08/13/2024 PCP: Pcp, No Referred by: Celestia Krabbe, NP  Subjective: Chief Complaint  Patient presents with   Right Hip - Pain   HPI: [name] is a pleasant 48 y.o. female who presents today for concern for a growing lump in her right lateral buttock  She has been undergoing PT for burning pain in her lateral hip and glute however reports that these symptoms have now resolved with PT and dry needling. Now reports that a lump was noticed in her glute that PT (and then she) noticed in August/early September which she was advised to get checked out. She does feel like it has been slightly enlarging.  She denies any pain or tenderness associated with the lump. Her movement is not restricted. She has not had similar issues in the past and has not noticed any similar findings elsewhere on her body. She denies any skin changes/redness/bruising/drainage. Denies fevers. Denies history of cancer.  She does report that during her previous surgeries including abdominoplasty they have done liposuction from her glute in 2021 with then a revision type in 2024. She has also undergone dry needling in the area.  Pertinent history includes abdominoplasty in 2021 with revision in 2024, augmentation mammoplasty in 2024, vitamin D  deficiency. She receives HRT with her primary OBGYN.  *HPI performed by Dr. Payton Coward  Pertinent ROS were reviewed with the patient and found to be negative unless otherwise specified above in HPI.   Assessment & Plan: Visit Diagnoses:  1. Palpable mass of soft tissue of pelvis   2. Pain in right hip    Plan: Impression is previous posterior hip and buttock pain that has resolved with formalized physical therapy, DN but with newer noticed palpable soft tissue mass in the posterior lateral buttock region.  This is firm, but mobile on examination. This  is not causing her pain.  She has a notable past surgical history with abdominoplasty with gluteal liposuction in this location, first back in 2021 with a revision in 2024.  Ultrasound examination does Calhoun a 3.8 x 1.3 cm collection that is mixed echogenicity in nature.  This does not have the exact characteristics that I would expect for a lipoma as it is not homogenous.  This is also between the subcutaneous and involving the overlying gluteal/muscular layer.  It is possible this is sequelae of postsurgical/liposuction involvement (?intramuscular hemorrhage with soft tissue treatment/DN), especially given that treatment was in this location.  However, given that it has been about 2 months and she does feel it is slightly growing in size, do feel the need to further evaluate to rule out any malignant abnormality with an MRI of the pelvis with and without contrast.  Patient is agreeable to this. I will review images of MRI and then discuss with patient via mychart/call or consider bringing patient in for office f/u depending on findings. Fine to continue her HEP in interim.  Meds & Orders: No orders of the defined types were placed in this encounter.   Orders Placed This Encounter  Procedures   XR HIP UNILAT W OR W/O PELVIS 2-3 VIEWS RIGHT   US  Extrem Low Right Ltd   MR PELVIS W WO CONTRAST     Procedures: No procedures performed      Clinical History: No specialty comments available.  She reports that she quit smoking about 27 years ago. Her smoking  use included cigarettes. She started smoking about 31 years ago. She has never used smokeless tobacco. No results for input(s): HGBA1C, LABURIC in the last 8760 hours.  Objective:    Physical Exam  Gen: Well-appearing, in no acute distress; non-toxic CV: Well-perfused. Warm.  Resp: Breathing unlabored on room air; no wheezing. Psych: Fluid speech in conversation; appropriate affect; normal thought process  Ortho Exam - R hip and  buttock: Inspection: no gross deformity, ecchymosis, swelling Palpation: approx 3cm well circumscribed mobile soft tissue mass palpated in lateral glute area. Nontender, nonfluctuant, no overlying skin changes.  ROM: FROM of hip without pain Strength: 5/5 hip flexion, extension, abduction, adduction Special tests: negative FABER/FADIR  Imaging:    US  Extrem Low Right Ltd Result Date: 08/13/2024 Limited musculoskeletal ultrasound of the right posterior hip/buttock was performed today.  Ultrasound demonstrates a relatively well-circumscribed soft tissue collection that is mixed echogenicity with a few pockets of isoechoic shadowing.  This does appear to be deep to the subcutaneous layer, in between subcutaneous and muscular/gluteus maximus involvement.  There is no tenderness with sono palpation.  There is no significant Doppler uptake with power Doppler.  There is no fluctuance upon standing.  This is approximately 3.8 x 1.3 cm in nature.  Total circumference is 6.3 cm.   XR HIP UNILAT W OR W/O PELVIS 2-3 VIEWS RIGHT Result Date: 08/13/2024 2 views of the right hip including AP pelvis, right hip frog-leg lateral was ordered and reviewed by myself today.  X-rays demonstrate femoral head well-seated within the acetabulum.  There is a mildly shortened femoral neck bilaterally, likely congenital.  There is no significant joint space narrowing or arthritic change.  Small spur off the lateral aspect of the iliac crest bilaterally.  No other  acute bony abnormality noted.   Past Medical/Family/Surgical/Social History: Medications & Allergies reviewed per EMR, new medications updated. Patient Active Problem List   Diagnosis Date Noted   Lichen sclerosus 06/02/2024   S/P endometrial ablation 06/02/2024   History of abdominoplasty 06/02/2024   History of heavy vaginal bleeding 10/04/2022   Vitamin D  deficiency 05/14/2022   Past Medical History:  Diagnosis Date   Abnormal uterine bleeding (AUB)     Allergy    Anemia    Anxiety    Arthritis    FEET   Asthma    Chronic kidney disease    Depression    Environmental allergies    GERD (gastroesophageal reflux disease)    Heavy menstrual bleeding    History of pregnancy induced hypertension    Hypertension    Wears contact lenses    Family History  Problem Relation Age of Onset   Hypertension Mother    Hypertension Father    Heart attack Father    Congestive Heart Failure Maternal Grandmother    Cancer Maternal Grandfather    Dementia Maternal Grandfather    Breast cancer Paternal Grandmother 51 - 34   Cancer Paternal Grandmother    Cancer Paternal Grandfather    Asthma Son    Colon cancer Neg Hx    Rectal cancer Neg Hx    Stomach cancer Neg Hx    Esophageal cancer Neg Hx    BRCA 1/2 Neg Hx    Past Surgical History:  Procedure Laterality Date   ABDOMINOPLASTY  05/2020   per PT THEN HAD REVISION 04/ 2022   AUGMENTATION MAMMAPLASTY Bilateral 06/05/2023   CESAREAN SECTION     11-30-2005;    @[facility]  by dr darcel  10-28-2007  COMBINED AUGMENTATION MAMMAPLASTY AND ABDOMINOPLASTY  06/05/2023   Also had liposuction   DILITATION & CURRETTAGE/HYSTROSCOPY WITH NOVASURE ABLATION N/A 10/04/2022   Procedure: DILATATION & CURETTAGE/HYSTEROSCOPY WITH NOVASURE ABLATION;  Surgeon: Cleatus Moccasin, MD;  Location: [facility];  Service: Gynecology;  Laterality: N/A;   NASAL SINUS SURGERY  1995   Social History   Occupational History   Not on file  Tobacco Use   Smoking status: Former    Current packs/day: 0.00    Types: Cigarettes    Start date: 26    Quit date: 1998    Years since quitting: 27.8   Smokeless tobacco: Never  Vaping Use   Vaping status: Never Used  Substance and Sexual Activity   Alcohol  use: Yes    Alcohol /week: 2.0 standard drinks of alcohol     Types: 2 Standard drinks or equivalent per week    Comment: Occasionally   Drug use: Never   Sexual activity: Yes    Partners: Male    Birth  control/protection: Other-see comments    Comment: partner s/p vasectomy

## 2024-08-13 NOTE — Therapy (Signed)
 OUTPATIENT PHYSICAL THERAPY TREATMENT NOTE   Patient Name: [name] MRN: [id number] DOB:1976-06-15, 48 y.o., female Today's Date: 08/13/2024  END OF SESSION:  PT End of Session - 08/13/24 0735     Visit Number 12    Date for Recertification  09/19/24    Authorization Type BC[employer] (DN signed 10/3)    PT Start Time 0731    PT Stop Time 0800    PT Time Calculation (min) 29 min    Activity Tolerance Patient tolerated treatment well    Behavior During Therapy WFL for tasks assessed/performed                 Past Medical History:  Diagnosis Date   Abnormal uterine bleeding (AUB)    Allergy    Anemia    Anxiety    Arthritis    FEET   Asthma    Chronic kidney disease    Depression    Environmental allergies    GERD (gastroesophageal reflux disease)    Heavy menstrual bleeding    History of pregnancy induced hypertension    Hypertension    Wears contact lenses    Past Surgical History:  Procedure Laterality Date   ABDOMINOPLASTY  05/2020   per PT THEN HAD REVISION 04/ 2022   AUGMENTATION MAMMAPLASTY Bilateral 06/05/2023   CESAREAN SECTION     11-30-2005;    @[facility]  by dr darcel  10-28-2007   COMBINED AUGMENTATION MAMMAPLASTY AND ABDOMINOPLASTY  06/05/2023   Also had liposuction   DILITATION & CURRETTAGE/HYSTROSCOPY WITH NOVASURE ABLATION N/A 10/04/2022   Procedure: DILATATION & CURETTAGE/HYSTEROSCOPY WITH NOVASURE ABLATION;  Surgeon: Cleatus Moccasin, MD;  Location: [facility] ;  Service: Gynecology;  Laterality: N/A;   NASAL SINUS SURGERY  1995   Patient Active Problem List   Diagnosis Date Noted   Lichen sclerosus 06/02/2024   S/P endometrial ablation 06/02/2024   History of abdominoplasty 06/02/2024   History of heavy vaginal bleeding 10/04/2022   Vitamin D  deficiency 05/14/2022    PCP:   REFERRING PROVIDER: Cleotilde Ronal RAMAN, MD  REFERRING DIAG: [phone] (ICD-10-CM) - History of abdominoplasty  Rationale for Evaluation and Treatment:  Rehabilitation  THERAPY DIAG:  Cramp and spasm  Pain in right hip  Muscle weakness (generalized)  ONSET DATE: Aug 2024 revision abdominoplasty with augmentation mammaplasty, initial abdominoplasty in 2021  SUBJECTIVE:                                                                                                                                                                                           SUBJECTIVE STATEMENT: Patient reports that she still occasionally gets the  achy feeling that she had in the beginning, but after she moves it goes away.  PERTINENT HISTORY:  Initial abdomoniplasty in 2021 to repair rectus diastasis/hernia which then failed and needed revision in 2024.  My Rt hip is a mess - better now than it has been.  I did a ton of pure barre 18mos ago and it got really bad Had big babies - 10 and 11lb Gets kneecap pain - especially descending stairs Started HRT about 30 days ago  PAIN:  PAIN:  Are you having pain? Yes NPRS scale: 1/10 Pain location: one tiny spot in my right glute/hip Pain orientation: Right, anterior and across lower abdomen PAIN TYPE: aching, burning, and tight Pain description: intermittent and constant  Aggravating factors: sitting Relieving factors: stretching, moving   PRECAUTIONS: None  RED FLAGS: None   WEIGHT BEARING RESTRICTIONS: No  FALLS:  Has patient fallen in last 6 months? 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  DIAGNOSTIC FINDINGS:     PATIENT SURVEYS:  Modified Oswestry:  MODIFIED OSWESTRY DISABILITY SCALE   Score Date: 06/27/24 08/05/2024  Pain intensity 1 = The pain is bad, but I can manage without having to take (1) I can stand as long as I want but, it increases my pain. pain medication. 0  2. Personal care (washing, dressing, etc.) 0 =  I can take care of myself normally without causing increased pain. 0  3. Lifting 0 = I can lift heavy weights without increased pain. 0  4. Walking 3 =  Pain prevents me from walking more than  mile. 0  5. Sitting 2 =  Pain prevents me from sitting more than 1 hour. 0  6. Standing 2 =  Pain prevents me from standing more than 1 hour 1  7. Sleeping 0 = Pain does not prevent me from sleeping well. 0  8. Social Life 1 =  My social life is normal, but it increases my level of pain. 0  9. Traveling 2 =  My pain restricts my travel over 2 hours. 0  10. Employment/ Homemaking 2 = I can perform most of my homemaking/job duties, but pain prevents me from performing more physically stressful activities (eg, lifting, vacuuming). 0  Total 13/50, 26% 1/50 = 2%   Interpretation of scores: Score Category Description  0-20% Minimal Disability The patient can cope with most living activities. Usually no treatment is indicated apart from advice on lifting, sitting and exercise  21-40% Moderate Disability The patient experiences more pain and difficulty with sitting, lifting and standing. Travel and social life are more difficult and they may be disabled from work. Personal care, sexual activity and sleeping are not grossly affected, and the patient can usually be managed by conservative means  41-60% Severe Disability Pain remains the main problem in this group, but activities of daily living are affected. These patients require a detailed investigation  61-80% Crippled Back pain impinges on all aspects of the patient's life. Positive intervention is required  81-100% Bed-bound  These  patients are  either bed-bound or exaggerating their symptoms  Bluford FORBES [name] [name] [name], et al. Surgery versus conservative management of stable thoracolumbar fracture: the PRESTO feasibility RCT. [place] ([place]): [employer]; 2021 Nov. [facility] Technology Assessment, No. 25.62.) Appendix 3, Oswestry Disability Index category descriptors. Available from: FindJewelers.cz  Minimally Clinically Important Difference (MCID) = 12.8%  COGNITION: Overall cognitive status: Within functional limits for tasks assessed     SENSATION: Will sometimes get numbness on Rt hip  MUSCLE LENGTH: Limited ITB bil  Limited hip flexor and quad on Rt side - very tight and painful when assessing Limited adductors on Rt side  POSTURE: right pelvic obliquity, Rt ilial outflare  PALPATION: Muscle spasms with trigger points present: Rt ITB/lateral quad, iliacus, psoas, gluteals, piriformis, adductors Rt patella limited mobility with crepitus, tender bil distal ITB  LUMBAR ROM:   AROM eval  Flexion Fingers to toes no pain  Extension End range limited with Pulling through abdominal wall  Right lateral flexion Contralateral trunk tightness, pain in Rt hip  Left lateral flexion Contralateral trunk tightness  Right rotation 50% no pain  Left rotation 50% no pain   (Blank rows = not tested)  LOWER EXTREMITY ROM:    Rt hip hypermobile in IR/ER Rt hip Pain into FADIR and FABER Rt anterior hip and thigh pulling and pain with hip extension, limited by pain 50%  LOWER EXTREMITY MMT:   Rt hip abd and ER 4/5, all other muscle groups 5/5 bil LE  LUMBAR SPECIAL TESTS:  + Rt FADIR AND FABER Rt hip hypermobile for both IR/ER + Ober's test on Rt  FUNCTIONAL TESTS:  Significant pelvic rotation in squat with drop of Rt hemipelvis Increased pronation of Rt foot  GAIT: Distance walked:  Assistive device utilized: None Level of assistance: Complete Independence Comments: no gait  deviations  TREATMENT DATE:   08/13/2024: Recumbent Bike Level 3 x 4 mins- PT present to discuss status Animal nutritionist with blue TB (PT anchoring) 2 x 10 bilateral  Supine UE/LE red pball pass 2x10 Side plank with top arm and leg coming towards each other (half crunch type motion) x10 bilat Around the world 15# kettlebell x 12 each direction Standing row with twist with 10# cable pulley x10 bilat Standing side lunges with foot on a slider x10 bilat Standing back lunges with foot on a slider x10 bilat Standing chop at cable column 10# x 12 bilateral   08/12/2024: Recumbent Bike Level 3 x 5 mins- PT present to discuss status Half kneeling chop with blue TB x 16 bilateral  Bird dog crunch with blue TB (PT anchoring) 2 x 10 bilateral  Plank off plinth + cross body march x 10 bilateral  Side plank with open book reach x10 bilat Pallof press at cable column 10# 2 x 15 bilateral  Squat taps to PT mat 2x10 Quadruped LE lift off with unilateral knee on yoga block x10 bilat Manual: scar tissue massage with silicon cup for improved tissue mobility and decreased restriction    08/07/2024: Recumbent Bike Level 3 x 5 mins- PT present to discuss status Half kneeling chop with blue TB x 10 bilateral  Bird dog crunch with blue TB (PT anchoring) 2 x 10 bilateral  Plank off plinth + cross body march x 10 bilateral  Pallof press at cable column 10# 2 x 15 bilateral  Standing chop at cable column 10# x 15 bilateral  Around the world 15# KB x 12 each direction Manual:  scar tissue massage with silicon cup for improved tissue mobility and decreased restriction                             PATIENT EDUCATION:  Education details: [email or web address] Person educated: Patient Education method: Programmer, multimedia, Facilities manager, Verbal cues, and Handouts Personnel officer) Education comprehension: verbalized understanding and returned demonstration  HOME EXERCISE PROGRAM: Access Code: [email or web address] URL:  [email or web address] Date: 07/25/2024 Prepared by: Kristeen Sar  Exercises - Seated Lateral Trunk Stretch on Swiss Ball  - 1 x daily - 7 x weekly - 1 sets - 3 reps - 10-30 sec hold - Standing Hip Flexor Stretch With Overhead Reach and Contralateral Sidebend  - 1 x daily - 7 x weekly - 1 sets - 3 reps - 10-30 sec hold - Clamshell  - 1 x daily - 7 x weekly - 2 sets - 10 reps - Supine Figure 4 Piriformis Stretch  - 1 x daily - 7 x weekly - 2 reps - 20 sec hold - Supine ITB Stretch with Strap  - 1 x daily - 7 x weekly - 2 reps - 20 sec hold - Supine Dead Bug with Leg Extension  - 1 x daily - 7 x weekly - 2 sets - 10 reps - Static Prone on Elbows  - 1 x daily - 7 x weekly - 1-2 reps - 3-5 min hold - Prone Alternating Arm and Leg Lifts  - 1 x daily - 7 x weekly - 2 sets - 10 reps - Single Leg Bridge  - 1 x daily - 7 x weekly - 1-2 sets - 10 reps - Side Plank with Clam and Resistance  - 1 x daily - 7 x weekly - 1 sets - 10 reps - Single Leg Sit to Stand with Arms Crossed  - 1 x daily - 7 x weekly - 1 sets - 10 reps - Quadruped Yoga Block Lift Off  - 1 x daily - 7 x weekly - 1-2 sets - 10-15 reps - Quadruped Fire Hydrant  - 1 x daily - 7 x weekly - 1-2 sets - 10 reps  ASSESSMENT:  CLINICAL IMPRESSION:  Rhyann presents to skilled PT reporting that she is feeling well after session yesterday.  Patient continues to progress with core strengthening during session.  Patient with difficulty with side planks with half crunch.  Patient continues to progress with improved form and states that she can feel that her core is strengthening.  Patient continues to require skilled PT to progress towards goal related activities.    OBJECTIVE IMPAIRMENTS: decreased knowledge of condition, decreased mobility, decreased ROM, decreased strength, increased fascial restrictions, increased muscle spasms, impaired flexibility, impaired sensation, improper body mechanics, postural dysfunction, and pain.    ACTIVITY LIMITATIONS: sitting, squatting, and stairs  PARTICIPATION LIMITATIONS: driving, community activity, and occupation  PERSONAL FACTORS: Time since onset of injury/illness/exacerbation are also affecting patient's functional outcome.   REHAB POTENTIAL: Excellent  CLINICAL DECISION MAKING: Evolving/moderate complexity  EVALUATION COMPLEXITY: Moderate   GOALS: Goals reviewed with patient? Yes  SHORT TERM GOALS: Target date: 07/25/24  Pt will be ind with initial HEP Baseline: Goal status: MET 07/17/2024  2.  Pt will report reduced pain with sitting for work by at least 25% Baseline:  Goal status: MET 07/25/2024  3.  Pt will be able to achieve stretching positions for Rt hip flexor and quad without pain Baseline:  Goal status: Met on 08/05/24  4.  Pt will be able to demo symmetrical pelvis with 1/2 squat Baseline:  Goal status: MET 07/25/2024    LONG TERM GOALS: Target date: 09/19/2024  Pt will be ind with advanced HEP Baseline:  Goal status: Ongoing 08/07/2024  2.  Pt will be able to  demo symmetrical pelvic control with full squat and fire hydrant. Baseline:  Goal status: Ongoing (still required cues) 08/07/2024  3.  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  Pt will improve ODI to </= 20% to reduce disability level from moderate to min on ODI scale. Baseline: 13/50 = 26% Goal status: Met on 08/05/2024 (1/50 = 2%)  5.  Pt will report overall pain not to exceed 3/10 most days of the week. Baseline:  Goal status: Ongoing 08/07/2024  6.  Pt will be able to perform a regular strength training program with no pain or discomfort in her right hip. Baseline:  Goal status: Ongoing    PLAN:  PT FREQUENCY: 1-2x/week  PT DURATION: 6 weeks  PLANNED INTERVENTIONS: 97110-Therapeutic exercises, 97530- Therapeutic activity, 97112- Neuromuscular re-education,  97535- Self Care, 02859- Manual therapy, G0283- Electrical stimulation (unattended), [phone]- Electrical stimulation (manual), [id number]- Traction (mechanical), [id number]- Ionotophoresis 4mg /ml Dexamethasone , 79439 (1-2 muscles), 20561 (3+ muscles)- Dry Needling, Patient/Family education, Stair training, Taping, Joint mobilization, Spinal mobilization, Scar mobilization, Cryotherapy, and Moist heat.  PLAN FOR NEXT SESSION: **patient needs to schedule more appointments (1x week until 11/21) proximal strengthening; strength for abd/ER/ext, functional body mechanics training working on symmetrical pelvis in squat; scar massage as needed     Jarrell Laming, PT, DPT 08/13/24, 10:13 AM  [facility] [address], [address] [place], [place] [address] Phone # [phone] Fax [phone]

## 2024-08-13 NOTE — Progress Notes (Signed)
 Patient says that she has been in physical therapy for her right hip since the end of August. She initially went to PT because of a deep burning through the lateral hip and glute, but says that this burning and discomfort has gone away with the exercises and treatment modalities (primarily dry needling). Patient says that they did notice a lump in the glute that seems to have grown in size. She denies any pain or discomfort from this spot specifically. She does enjoy strength training at home.

## 2024-08-25 ENCOUNTER — Telehealth: Payer: Self-pay | Admitting: *Deleted

## 2024-08-25 ENCOUNTER — Ambulatory Visit (HOSPITAL_BASED_OUTPATIENT_CLINIC_OR_DEPARTMENT_OTHER): Admitting: Obstetrics & Gynecology

## 2024-08-25 ENCOUNTER — Encounter (HOSPITAL_BASED_OUTPATIENT_CLINIC_OR_DEPARTMENT_OTHER): Payer: Self-pay | Admitting: Obstetrics & Gynecology

## 2024-08-25 VITALS — BP 128/96 | HR 69 | Ht 64.0 in | Wt 155.0 lb

## 2024-08-25 DIAGNOSIS — R6882 Decreased libido: Secondary | ICD-10-CM | POA: Diagnosis not present

## 2024-08-25 DIAGNOSIS — Z79890 Hormone replacement therapy (postmenopausal): Secondary | ICD-10-CM

## 2024-08-25 NOTE — Telephone Encounter (Signed)
 Pt called left vm for me to return her call. I tried calling pt, left vm for her to return my call in reference to MRI>

## 2024-08-25 NOTE — Progress Notes (Unsigned)
   GYNECOLOGY  VISIT  CC:   Follow up after starting HRT   HPI: 48 y.o. G63P2000 Married White or Caucasian female here for medication management. Patient reports that she is doing very well on medication regimen. Using estra-test 0.625/1.25mg  dosage as well as prometrium  200m nightly days 1-15. Not having any bleeding.  No problems with this regimen.  Discussed checking testosterone  level, pros/cons, especially since she is feeling so well.  Not sure would add any additional testosterone  at this time.  She would like to have this done.  Does not need RFs for anything at this time.    Denies vaginal bleeding.  Last MMG 06/03/2024.  No LMP recorded. Patient has had an ablation.  Past Medical History:  Diagnosis Date   Abnormal uterine bleeding (AUB)    Allergy    Anemia    Anxiety    Arthritis    FEET   Asthma    Chronic kidney disease    Depression    Environmental allergies    GERD (gastroesophageal reflux disease)    Heavy menstrual bleeding    History of pregnancy induced hypertension    Hypertension    Wears contact lenses     MEDS:  Reviewed in EPIC  ALLERGIES: Biaxin [clarithromycin]; Antihistamines, diphenhydramine-type; and Augmentin [amoxicillin -pot clavulanate]  SH:  married, non smoker  Review of Systems  Constitutional: Negative.   Genitourinary: Negative.     PHYSICAL EXAMINATION:    BP (!) 128/96 (BP Location: Left Arm, Patient Position: Sitting, Cuff Size: Normal)   Pulse 69   Ht 5' 4 (1.626 m)   Wt 155 lb (70.3 kg)   SpO2 100%   BMI 26.61 kg/m     Physical Exam Constitutional:      Appearance: Normal appearance.  Neurological:     General: No focal deficit present.     Mental Status: She is alert.  Psychiatric:        Mood and Affect: Mood normal.        Behavior: Behavior normal.     Assessment/Plan: 1. Hormone replacement therapy (HRT) (Primary) - currently on estratest 0.652/1.25mg  daily dosing with prometrium  200mg  days 1-15 each  month.    2. H/o decreased libido - will check level today - Testosterone , Total, LC/MS/MS

## 2024-08-26 ENCOUNTER — Encounter: Payer: Self-pay | Admitting: Sports Medicine

## 2024-08-27 LAB — TESTOSTERONE, TOTAL, LC/MS/MS: Testosterone, total: 8.1 ng/dL

## 2024-08-29 ENCOUNTER — Ambulatory Visit (HOSPITAL_BASED_OUTPATIENT_CLINIC_OR_DEPARTMENT_OTHER): Payer: Self-pay | Admitting: Obstetrics & Gynecology

## 2024-08-29 DIAGNOSIS — R6882 Decreased libido: Secondary | ICD-10-CM | POA: Insufficient documentation

## 2024-09-01 ENCOUNTER — Encounter: Payer: Self-pay | Admitting: Radiology

## 2024-09-05 ENCOUNTER — Ambulatory Visit
Admission: RE | Admit: 2024-09-05 | Discharge: 2024-09-05 | Disposition: A | Discharge status: Home / Self Care | Source: Ambulatory Visit | Attending: Sports Medicine | Admitting: Sports Medicine

## 2024-09-05 DIAGNOSIS — M7989 Other specified soft tissue disorders: Secondary | ICD-10-CM

## 2024-09-05 DIAGNOSIS — M25551 Pain in right hip: Secondary | ICD-10-CM

## 2024-09-05 DIAGNOSIS — R222 Localized swelling, mass and lump, trunk: Secondary | ICD-10-CM | POA: Diagnosis not present

## 2024-09-05 MED ORDER — GADOPICLENOL 0.5 MMOL/ML IV SOLN
7.0000 mL | Freq: Once | INTRAVENOUS | Status: AC | PRN
  Administered 2024-09-05: 7 mL via INTRAVENOUS

## 2024-09-09 ENCOUNTER — Ambulatory Visit: Payer: Self-pay | Admitting: Sports Medicine

## 2024-11-27 ENCOUNTER — Other Ambulatory Visit (HOSPITAL_BASED_OUTPATIENT_CLINIC_OR_DEPARTMENT_OTHER): Payer: Self-pay | Admitting: Obstetrics & Gynecology

## 2024-11-27 DIAGNOSIS — N951 Menopausal and female climacteric states: Secondary | ICD-10-CM

## 2025-06-05 ENCOUNTER — Ambulatory Visit (HOSPITAL_BASED_OUTPATIENT_CLINIC_OR_DEPARTMENT_OTHER): Admitting: Obstetrics & Gynecology
